# Patient Record
Sex: Female | Born: 1950 | Race: Black or African American | Hispanic: No | Smoking: Never smoker
Health system: Southern US, Community
[De-identification: ages and names within clinical notes are randomized; demographics above are authoritative.]

## PROBLEM LIST (undated history)

## (undated) DIAGNOSIS — N289 Disorder of kidney and ureter, unspecified: Secondary | ICD-10-CM

## (undated) DIAGNOSIS — N200 Calculus of kidney: Secondary | ICD-10-CM

## (undated) DIAGNOSIS — I340 Nonrheumatic mitral (valve) insufficiency: Secondary | ICD-10-CM

## (undated) DIAGNOSIS — M545 Low back pain, unspecified: Secondary | ICD-10-CM

## (undated) DIAGNOSIS — G629 Polyneuropathy, unspecified: Secondary | ICD-10-CM

## (undated) DIAGNOSIS — I428 Other cardiomyopathies: Secondary | ICD-10-CM

## (undated) DIAGNOSIS — I509 Heart failure, unspecified: Secondary | ICD-10-CM

## (undated) DIAGNOSIS — G4700 Insomnia, unspecified: Secondary | ICD-10-CM

## (undated) DIAGNOSIS — I10 Essential (primary) hypertension: Secondary | ICD-10-CM

## (undated) DIAGNOSIS — I5040 Unspecified combined systolic (congestive) and diastolic (congestive) heart failure: Secondary | ICD-10-CM

## (undated) DIAGNOSIS — I495 Sick sinus syndrome: Secondary | ICD-10-CM

## (undated) DIAGNOSIS — E0590 Thyrotoxicosis, unspecified without thyrotoxic crisis or storm: Secondary | ICD-10-CM

## (undated) DIAGNOSIS — E669 Obesity, unspecified: Secondary | ICD-10-CM

## (undated) DIAGNOSIS — E039 Hypothyroidism, unspecified: Secondary | ICD-10-CM

## (undated) DIAGNOSIS — F339 Major depressive disorder, recurrent, unspecified: Secondary | ICD-10-CM

## (undated) HISTORY — DX: Hypothyroidism, unspecified: E03.9

## (undated) HISTORY — DX: Thyrotoxicosis, unspecified without thyrotoxic crisis or storm: E05.90

## (undated) HISTORY — DX: Low back pain: M54.5

## (undated) HISTORY — DX: Essential (primary) hypertension: I10

## (undated) HISTORY — DX: Calculus of kidney: N20.0

## (undated) HISTORY — DX: Disorder of kidney and ureter, unspecified: N28.9

## (undated) HISTORY — DX: Nonrheumatic mitral (valve) insufficiency: I34.0

## (undated) HISTORY — DX: Insomnia, unspecified: G47.00

## (undated) HISTORY — DX: Polyneuropathy, unspecified: G62.9

## (undated) HISTORY — DX: Sick sinus syndrome: I49.5

## (undated) HISTORY — DX: Obesity, unspecified: E66.9

## (undated) HISTORY — PX: ELBOW SURGERY: SHX618

## (undated) HISTORY — DX: Major depressive disorder, recurrent, unspecified: F33.9

## (undated) HISTORY — DX: Unspecified combined systolic (congestive) and diastolic (congestive) heart failure: I50.40

## (undated) HISTORY — DX: Low back pain, unspecified: M54.50

## (undated) HISTORY — DX: Other cardiomyopathies: I42.8

---

## 1980-04-27 HISTORY — PX: CHOLECYSTECTOMY: SHX55

## 1997-12-27 ENCOUNTER — Ambulatory Visit (HOSPITAL_COMMUNITY): Admission: RE | Admit: 1997-12-27 | Discharge: 1997-12-27 | Payer: Self-pay | Admitting: *Deleted

## 1998-05-31 ENCOUNTER — Ambulatory Visit (HOSPITAL_COMMUNITY): Admission: RE | Admit: 1998-05-31 | Discharge: 1998-05-31 | Payer: Self-pay | Admitting: *Deleted

## 1998-05-31 ENCOUNTER — Encounter: Payer: Self-pay | Admitting: *Deleted

## 1998-06-12 ENCOUNTER — Inpatient Hospital Stay (HOSPITAL_COMMUNITY): Admission: RE | Admit: 1998-06-12 | Discharge: 1998-06-12 | Payer: Self-pay | Admitting: *Deleted

## 1999-06-25 ENCOUNTER — Encounter: Payer: Self-pay | Admitting: *Deleted

## 1999-06-25 ENCOUNTER — Ambulatory Visit (HOSPITAL_COMMUNITY): Admission: RE | Admit: 1999-06-25 | Discharge: 1999-06-25 | Payer: Self-pay | Admitting: *Deleted

## 1999-06-25 ENCOUNTER — Inpatient Hospital Stay (HOSPITAL_COMMUNITY): Admission: EM | Admit: 1999-06-25 | Discharge: 1999-07-04 | Payer: Self-pay | Admitting: Emergency Medicine

## 1999-06-26 ENCOUNTER — Encounter: Payer: Self-pay | Admitting: *Deleted

## 1999-06-27 ENCOUNTER — Encounter: Payer: Self-pay | Admitting: *Deleted

## 1999-07-01 ENCOUNTER — Encounter: Payer: Self-pay | Admitting: *Deleted

## 1999-07-02 ENCOUNTER — Encounter: Payer: Self-pay | Admitting: Endocrinology

## 1999-08-15 ENCOUNTER — Emergency Department (HOSPITAL_COMMUNITY): Admission: EM | Admit: 1999-08-15 | Discharge: 1999-08-15 | Payer: Self-pay | Admitting: Emergency Medicine

## 1999-08-15 ENCOUNTER — Encounter: Payer: Self-pay | Admitting: Emergency Medicine

## 1999-10-27 ENCOUNTER — Ambulatory Visit (HOSPITAL_COMMUNITY): Admission: RE | Admit: 1999-10-27 | Discharge: 1999-10-27 | Payer: Self-pay | Admitting: *Deleted

## 1999-10-28 ENCOUNTER — Encounter: Payer: Self-pay | Admitting: *Deleted

## 2000-05-20 ENCOUNTER — Ambulatory Visit (HOSPITAL_COMMUNITY): Admission: RE | Admit: 2000-05-20 | Discharge: 2000-05-20 | Payer: Self-pay | Admitting: *Deleted

## 2000-05-20 ENCOUNTER — Encounter: Payer: Self-pay | Admitting: *Deleted

## 2000-08-02 ENCOUNTER — Ambulatory Visit (HOSPITAL_COMMUNITY): Admission: RE | Admit: 2000-08-02 | Discharge: 2000-08-02 | Payer: Self-pay | Admitting: *Deleted

## 2000-08-02 ENCOUNTER — Encounter: Payer: Self-pay | Admitting: *Deleted

## 2000-08-03 ENCOUNTER — Inpatient Hospital Stay (HOSPITAL_COMMUNITY): Admission: EM | Admit: 2000-08-03 | Discharge: 2000-08-06 | Payer: Self-pay | Admitting: Emergency Medicine

## 2000-08-05 ENCOUNTER — Encounter: Payer: Self-pay | Admitting: Interventional Cardiology

## 2000-09-28 ENCOUNTER — Encounter: Payer: Self-pay | Admitting: *Deleted

## 2000-09-28 ENCOUNTER — Ambulatory Visit (HOSPITAL_COMMUNITY): Admission: RE | Admit: 2000-09-28 | Discharge: 2000-09-28 | Payer: Self-pay | Admitting: *Deleted

## 2000-10-08 ENCOUNTER — Encounter: Payer: Self-pay | Admitting: Interventional Cardiology

## 2000-10-08 ENCOUNTER — Inpatient Hospital Stay (HOSPITAL_COMMUNITY): Admission: EM | Admit: 2000-10-08 | Discharge: 2000-10-12 | Payer: Self-pay | Admitting: Emergency Medicine

## 2002-08-15 ENCOUNTER — Encounter: Payer: Self-pay | Admitting: Emergency Medicine

## 2002-08-15 ENCOUNTER — Inpatient Hospital Stay (HOSPITAL_COMMUNITY): Admission: EM | Admit: 2002-08-15 | Discharge: 2002-08-19 | Payer: Self-pay | Admitting: Emergency Medicine

## 2002-08-16 ENCOUNTER — Encounter: Payer: Self-pay | Admitting: Interventional Cardiology

## 2002-09-04 ENCOUNTER — Encounter: Payer: Self-pay | Admitting: *Deleted

## 2002-09-04 ENCOUNTER — Ambulatory Visit (HOSPITAL_COMMUNITY): Admission: RE | Admit: 2002-09-04 | Discharge: 2002-09-04 | Payer: Self-pay | Admitting: *Deleted

## 2003-05-18 ENCOUNTER — Emergency Department (HOSPITAL_COMMUNITY): Admission: EM | Admit: 2003-05-18 | Discharge: 2003-05-18 | Payer: Self-pay | Admitting: Emergency Medicine

## 2003-05-30 ENCOUNTER — Encounter: Admission: RE | Admit: 2003-05-30 | Discharge: 2003-05-30 | Payer: Self-pay | Admitting: *Deleted

## 2004-04-24 ENCOUNTER — Other Ambulatory Visit: Admission: RE | Admit: 2004-04-24 | Discharge: 2004-04-24 | Payer: Self-pay | Admitting: Internal Medicine

## 2004-05-07 ENCOUNTER — Encounter: Admission: RE | Admit: 2004-05-07 | Discharge: 2004-05-07 | Payer: Self-pay | Admitting: Internal Medicine

## 2005-04-09 ENCOUNTER — Emergency Department (HOSPITAL_COMMUNITY): Admission: EM | Admit: 2005-04-09 | Discharge: 2005-04-09 | Payer: Self-pay | Admitting: Emergency Medicine

## 2006-04-10 ENCOUNTER — Emergency Department (HOSPITAL_COMMUNITY): Admission: EM | Admit: 2006-04-10 | Discharge: 2006-04-10 | Payer: Self-pay | Admitting: Emergency Medicine

## 2006-07-06 ENCOUNTER — Other Ambulatory Visit: Admission: RE | Admit: 2006-07-06 | Discharge: 2006-07-06 | Payer: Self-pay | Admitting: Internal Medicine

## 2006-07-07 ENCOUNTER — Encounter: Admission: RE | Admit: 2006-07-07 | Discharge: 2006-07-07 | Payer: Self-pay | Admitting: Internal Medicine

## 2007-07-07 ENCOUNTER — Other Ambulatory Visit: Admission: RE | Admit: 2007-07-07 | Discharge: 2007-07-07 | Payer: Self-pay | Admitting: Internal Medicine

## 2008-12-19 ENCOUNTER — Encounter: Admission: RE | Admit: 2008-12-19 | Discharge: 2008-12-19 | Payer: Self-pay | Admitting: Internal Medicine

## 2010-02-24 ENCOUNTER — Other Ambulatory Visit: Admission: RE | Admit: 2010-02-24 | Discharge: 2010-02-24 | Payer: Self-pay | Admitting: Internal Medicine

## 2010-03-27 ENCOUNTER — Ambulatory Visit (HOSPITAL_COMMUNITY): Admission: RE | Admit: 2010-03-27 | Discharge: 2010-03-27 | Payer: Self-pay | Admitting: Gastroenterology

## 2011-09-26 DIAGNOSIS — N289 Disorder of kidney and ureter, unspecified: Secondary | ICD-10-CM

## 2011-09-26 HISTORY — DX: Disorder of kidney and ureter, unspecified: N28.9

## 2011-10-27 ENCOUNTER — Other Ambulatory Visit: Payer: Self-pay | Admitting: Geriatric Medicine

## 2011-10-27 ENCOUNTER — Ambulatory Visit
Admission: RE | Admit: 2011-10-27 | Discharge: 2011-10-27 | Disposition: A | Discharge status: Home / Self Care | Payer: PRIVATE HEALTH INSURANCE | Source: Ambulatory Visit | Attending: Geriatric Medicine | Admitting: Geriatric Medicine

## 2011-10-27 ENCOUNTER — Other Ambulatory Visit: Payer: Self-pay

## 2011-10-27 DIAGNOSIS — R109 Unspecified abdominal pain: Secondary | ICD-10-CM

## 2011-10-27 MED ORDER — IOHEXOL 300 MG/ML  SOLN
100.0000 mL | Freq: Once | INTRAMUSCULAR | Status: AC | PRN
  Administered 2011-10-27: 100 mL via INTRAVENOUS

## 2011-10-28 ENCOUNTER — Other Ambulatory Visit: Payer: Self-pay

## 2011-10-31 ENCOUNTER — Emergency Department (INDEPENDENT_AMBULATORY_CARE_PROVIDER_SITE_OTHER): Payer: PRIVATE HEALTH INSURANCE

## 2011-10-31 ENCOUNTER — Emergency Department (INDEPENDENT_AMBULATORY_CARE_PROVIDER_SITE_OTHER)
Admission: EM | Admit: 2011-10-31 | Discharge: 2011-10-31 | Disposition: A | Discharge status: Home Health | Payer: PRIVATE HEALTH INSURANCE | Source: Home / Self Care | Attending: Family Medicine | Admitting: Family Medicine

## 2011-10-31 ENCOUNTER — Encounter (HOSPITAL_COMMUNITY): Payer: Self-pay

## 2011-10-31 DIAGNOSIS — S63502A Unspecified sprain of left wrist, initial encounter: Secondary | ICD-10-CM

## 2011-10-31 DIAGNOSIS — S63509A Unspecified sprain of unspecified wrist, initial encounter: Secondary | ICD-10-CM

## 2011-10-31 HISTORY — DX: Heart failure, unspecified: I50.9

## 2011-10-31 MED ORDER — HYDROCODONE-ACETAMINOPHEN 5-325 MG PO TABS: 1.0000 | ORAL_TABLET | Freq: Four times a day (QID) | ORAL | Status: AC | PRN

## 2011-10-31 NOTE — ED Notes (Signed)
Pt fell yesterday and attempted to catch herself with lt arm, now having lt wrist pain.

## 2011-10-31 NOTE — ED Provider Notes (Signed)
History     CSN: [id number]  Arrival date & time 10/31/11  1204   First MD Initiated Contact with Patient 10/31/11 1217      Chief Complaint  Patient presents with  . Wrist Pain    (Consider location/radiation/quality/duration/timing/severity/associated sxs/prior treatment) Patient is a 61 y.o. female presenting with wrist pain. The history is provided by the patient.  Wrist Pain This is a new problem. The current episode started yesterday (fell yest onto left hand , c/o wrist pain, no other injury.). The problem has been gradually worsening.    Past Medical History  Diagnosis Date  . Hypertension   . CHF (congestive heart failure)     Past Surgical History  Procedure Date  . Cholecystectomy     History reviewed. No pertinent family history.  History  Substance Use Topics  . Smoking status: Never Smoker   . Smokeless tobacco: Not on file  . Alcohol Use: No    OB History    Grav Para Term Preterm Abortions TAB SAB Ect Mult Living                  Review of Systems  Constitutional: Negative.   Musculoskeletal: Positive for joint swelling. Negative for back pain and gait problem.  Skin: Negative for wound.  Neurological: Negative.     Allergies  Review of patient's allergies indicates no known allergies.  Home Medications   Current Outpatient Rx  Name Route Sig Dispense Refill  . ASPIRIN 81 MG PO TABS Oral Take 81 mg by mouth daily.    Marland Kitchen CARVEDILOL 25 MG PO TABS Oral Take 25 mg by mouth 2 (two) times daily with a meal.    . ESZOPICLONE 1 MG PO TABS Oral Take 1 mg by mouth at bedtime. Take immediately before bedtime    . FUROSEMIDE 40 MG PO TABS Oral Take 40 mg by mouth 2 (two) times daily.    Marland Kitchen RAMIPRIL 10 MG PO TABS Oral Take 10 mg by mouth 2 (two) times daily.    . SERTRALINE HCL 100 MG PO TABS Oral Take 100 mg by mouth daily.    Marland Kitchen HYDROCODONE-ACETAMINOPHEN 5-325 MG PO TABS Oral Take 1 tablet by mouth every 6 (six) hours as needed for pain. 12 tablet 0      BP 145/83  Pulse 52  Temp 98.4 F (36.9 C) (Oral)  Resp 16  SpO2 96%  Physical Exam  Nursing note and vitals reviewed. Constitutional: She is oriented to person, place, and time. She appears well-developed and well-nourished.  HENT:  Head: Normocephalic and atraumatic.  Neck: Normal range of motion. Neck supple.  Musculoskeletal: She exhibits tenderness.       Left wrist: She exhibits decreased range of motion, tenderness and bony tenderness. She exhibits no effusion and no deformity.       Arms: Neurological: She is alert and oriented to person, place, and time.  Skin: Skin is warm and dry.    ED Course  Procedures (including critical care time)  Labs Reviewed - No data to display Dg Wrist Complete Left  10/31/2011  *RADIOLOGY REPORT*  Clinical Data: Post fall, now with ulnar-sided wrist pain  LEFT WRIST - COMPLETE 3+ VIEW  Comparison: None.  Findings: No fracture or dislocation. No definite displacement of the pronator quadratus fat pad.  Degenerative changes of the STT joints of the base of the thumb. No evidence of chondrocalcinosis. Regional soft tissues are normal.  IMPRESSION: No fracture.  If the patient  has pain referable to the anatomic snuff box, splinting and a follow-up radiograph in 10 to 14 days is recommended to evaluate for occult scaphoid fracture.  Original Report Authenticated By: Waynard Reeds, M.D.     1. Sprain of left wrist       MDM  X-rays reviewed and report per radiologist.         Linna Hoff, MD 10/31/11 1340

## 2012-03-14 ENCOUNTER — Other Ambulatory Visit: Payer: Self-pay | Admitting: Internal Medicine

## 2012-03-16 ENCOUNTER — Other Ambulatory Visit: Payer: Self-pay | Admitting: Internal Medicine

## 2012-03-16 DIAGNOSIS — Z1231 Encounter for screening mammogram for malignant neoplasm of breast: Secondary | ICD-10-CM

## 2012-03-17 ENCOUNTER — Other Ambulatory Visit (HOSPITAL_COMMUNITY)
Admission: RE | Admit: 2012-03-17 | Discharge: 2012-03-17 | Disposition: A | Discharge status: Home / Self Care | Payer: PRIVATE HEALTH INSURANCE | Source: Ambulatory Visit | Attending: Internal Medicine | Admitting: Internal Medicine

## 2012-03-17 ENCOUNTER — Other Ambulatory Visit: Payer: Self-pay | Admitting: Internal Medicine

## 2012-03-17 DIAGNOSIS — Z124 Encounter for screening for malignant neoplasm of cervix: Secondary | ICD-10-CM | POA: Insufficient documentation

## 2012-03-17 DIAGNOSIS — Z1151 Encounter for screening for human papillomavirus (HPV): Secondary | ICD-10-CM | POA: Insufficient documentation

## 2012-04-26 ENCOUNTER — Ambulatory Visit: Payer: PRIVATE HEALTH INSURANCE

## 2013-03-06 ENCOUNTER — Encounter: Payer: Self-pay | Admitting: Interventional Cardiology

## 2013-05-22 ENCOUNTER — Ambulatory Visit: Payer: PRIVATE HEALTH INSURANCE | Admitting: Interventional Cardiology

## 2013-05-31 ENCOUNTER — Other Ambulatory Visit: Payer: Self-pay | Admitting: Interventional Cardiology

## 2013-06-08 ENCOUNTER — Ambulatory Visit (INDEPENDENT_AMBULATORY_CARE_PROVIDER_SITE_OTHER): Payer: PRIVATE HEALTH INSURANCE | Admitting: Interventional Cardiology

## 2013-06-08 ENCOUNTER — Encounter: Payer: Self-pay | Admitting: Interventional Cardiology

## 2013-06-08 VITALS — BP 138/76 | HR 63 | Ht 64.0 in | Wt 173.0 lb

## 2013-06-08 DIAGNOSIS — I5042 Chronic combined systolic (congestive) and diastolic (congestive) heart failure: Secondary | ICD-10-CM

## 2013-06-08 DIAGNOSIS — I428 Other cardiomyopathies: Secondary | ICD-10-CM

## 2013-06-08 DIAGNOSIS — E0590 Thyrotoxicosis, unspecified without thyrotoxic crisis or storm: Secondary | ICD-10-CM | POA: Insufficient documentation

## 2013-06-08 DIAGNOSIS — I10 Essential (primary) hypertension: Secondary | ICD-10-CM

## 2013-06-08 DIAGNOSIS — I509 Heart failure, unspecified: Secondary | ICD-10-CM | POA: Insufficient documentation

## 2013-06-08 DIAGNOSIS — E079 Disorder of thyroid, unspecified: Secondary | ICD-10-CM

## 2013-06-08 MED ORDER — CARVEDILOL 25 MG PO TABS: 25.0000 mg | ORAL_TABLET | Freq: Two times a day (BID) | ORAL | Status: DC

## 2013-06-08 MED ORDER — FUROSEMIDE 40 MG PO TABS: ORAL_TABLET | ORAL | Status: DC

## 2013-06-08 MED ORDER — RAMIPRIL 5 MG PO CAPS: ORAL_CAPSULE | ORAL | Status: DC

## 2013-06-08 NOTE — Patient Instructions (Signed)
Your physician wants you to follow-up in: 1 year with Dr. Smith You will receive a reminder letter in the mail two months in advance. If you don't receive a letter, please call our office to schedule the follow-up appointment.  Your physician recommends that you continue on your current medications as directed. Please refer to the Current Medication list given to you today.  

## 2013-06-08 NOTE — Progress Notes (Signed)
Patient ID: Katie Lee, female   DOB: 04/08/1951, 63 y.o.   MRN: [id number] Past Medical History  Hypertension   Non ischemic cardiomyopathy LVEF 20% ,2004. LVEF 40% , 2008. EF 45% echo 2012--Cardiology Dr Katrinka Blazing   Depression.   h/o hyperthyroid per patient tx with PTU, no I 131   Insomnia   09/2011 cystic structure upper pole right kidney question chronic scarring CT 09/2011   renal calculus 9 x 6 x 9 mm CT 09/2011      [address] [place], [place]  [address] Phone: [phone] Fax:  [phone]  Date:  06/08/2013   ID:  Katie Lee, DOB [birth year], MRN [id number]  PCP:  Katie Apo, MD   ASSESSMENT:  1. 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 Currently stable 2. Hypertension, controlled 3. Intermittent numbness left fifth finger  PLAN:  1. I encouraged weight loss via decreased caloric intake and aerobic exercise 2. Clinical followup in one year   SUBJECTIVE: Katie Lee is a 63 y.o. female who is doing well. She denies orthopnea, PND, and syncope. No medication side effects. No edema. Overall doing well.   Wt Readings from Last 3 Encounters:  06/08/13 173 lb (78.472 kg)     Past Medical History  Diagnosis Date  . CHF (congestive heart failure)   . Obesity, unspecified   . Essential hypertension, benign     Poor control related to weight gain  . 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 Unspecified hypothyroidism   . Peripheral neuropathy   . Sinoatrial node dysfunction     Sinus bradycardia, asymptomatic  . Low back pain   . Major depressive disorder, recurrent episode, unspecified   . Combined congestive systolic and diastolic heart failure     Asymptomatic  . Non-ischemic cardiomyopathy     EF 45% by ECHO 2012  . Insomnia   . Kidney problem 09/2011    Cystic structure upper  pole right kidney question scarring CT 09/2011  . Renal calculus     9 x 6 x 9 mm by CT 09/2011    Current Outpatient Prescriptions  Medication Sig Dispense Refill  . aspirin 81 MG tablet Take 81 mg by mouth daily.      . carvedilol (COREG) 25 MG tablet Take 25 mg by mouth 2 (two) times daily with a meal.      . Eszopiclone (ESZOPICLONE) 3 MG TABS Take 3 mg by mouth at bedtime. Take immediately before bedtime      . furosemide (LASIX) 40 MG tablet TAKE 1 TABLET BY MOUTH TWICE DAILY  60 tablet  2  . ramipril (ALTACE) 5 MG capsule TAKE 1 CAPSULE BY MOUTH TWICE DAILY  60 capsule  2  . sertraline (ZOLOFT) 100 MG tablet Take 100 mg by mouth daily.       No current facility-administered medications for this visit.    Allergies:   No Known Allergies  Social History:  The patient  reports that she has never smoked. She does not have any smokeless tobacco history on file. She reports that she does not drink alcohol or use illicit drugs.   ROS:  Please see the history of present illness.   No medication side effects   All other systems reviewed and negative.   OBJECTIVE: VS:  BP 138/76  Pulse 63  Ht  5\' 4"  (1.626 m)  Wt 173 lb (78.472 kg)  BMI 29.68 kg/m2 Well nourished, well developed, in no acute distress, moderate obesity HEENT: normal Neck: JVD flat. Carotid bruit absent  Cardiac:  normal S1, S2; RRR; no murmur. An S4 gallop is audible. Lungs:  clear to auscultation bilaterally, no wheezing, rhonchi or rales Abd: soft, nontender, no hepatomegaly Ext: Edema absent. Pulses 2+ and symmetric Skin: warm and dry Neuro:  CNs 2-12 intact, no focal abnormalities noted  EKG:  Normal sinus rhythm with left atrial abnormality and nonspecific T-wave abnormality. Otherwise normal. No areas from prior tracing 2012 to       Signed, Darci NeedleHenry W. B. Saide Lanuza III, MD 06/08/2013 2:37 PM

## 2013-12-12 ENCOUNTER — Other Ambulatory Visit: Payer: Self-pay | Admitting: Interventional Cardiology

## 2014-04-19 ENCOUNTER — Other Ambulatory Visit: Payer: Self-pay | Admitting: Interventional Cardiology

## 2014-05-17 ENCOUNTER — Other Ambulatory Visit: Payer: Self-pay | Admitting: Interventional Cardiology

## 2014-06-18 ENCOUNTER — Other Ambulatory Visit: Payer: Self-pay | Admitting: Interventional Cardiology

## 2014-06-19 ENCOUNTER — Other Ambulatory Visit: Payer: Self-pay | Admitting: Interventional Cardiology

## 2014-06-29 ENCOUNTER — Telehealth: Payer: Self-pay | Admitting: Physician Assistant

## 2014-06-29 ENCOUNTER — Other Ambulatory Visit: Payer: Self-pay | Admitting: Physician Assistant

## 2014-06-29 MED ORDER — ZOLPIDEM TARTRATE 5 MG PO TABS
5.0000 mg | ORAL_TABLET | Freq: Every evening | ORAL | Status: DC | PRN
Start: 2014-06-29 — End: 2023-07-15

## 2014-06-29 MED ORDER — ZOLPIDEM TARTRATE 5 MG PO TABS: 5.0000 mg | ORAL_TABLET | Freq: Every evening | ORAL | Status: DC | PRN

## 2014-06-29 NOTE — Telephone Encounter (Signed)
Attempted to contact patient a second time, got voice mail instead.  Ramond Dial PA Pager: [phone]

## 2014-06-29 NOTE — Telephone Encounter (Signed)
She called back to cardiology service. Patient states she run of zolpidem and unable to sleep. I have asked her to call her PCP on Monday to obtain Rx, I will only give her 7 tablet for now. I have also advised her to discuss with her PCP if she need more.   Ramond Dial PA Pager: [phone]

## 2014-07-25 ENCOUNTER — Other Ambulatory Visit: Payer: Self-pay | Admitting: Interventional Cardiology

## 2014-09-26 ENCOUNTER — Other Ambulatory Visit: Payer: Self-pay | Admitting: Interventional Cardiology

## 2014-10-05 ENCOUNTER — Encounter: Payer: Self-pay | Admitting: Interventional Cardiology

## 2014-10-05 ENCOUNTER — Ambulatory Visit (INDEPENDENT_AMBULATORY_CARE_PROVIDER_SITE_OTHER): Payer: Medicare Other | Admitting: Interventional Cardiology

## 2014-10-05 VITALS — BP 114/68 | HR 70 | Ht 64.0 in | Wt 180.2 lb

## 2014-10-05 DIAGNOSIS — I429 Cardiomyopathy, unspecified: Secondary | ICD-10-CM | POA: Diagnosis not present

## 2014-10-05 DIAGNOSIS — I10 Essential (primary) hypertension: Secondary | ICD-10-CM

## 2014-10-05 DIAGNOSIS — I428 Other cardiomyopathies: Secondary | ICD-10-CM

## 2014-10-05 DIAGNOSIS — E0590 Thyrotoxicosis, unspecified without thyrotoxic crisis or storm: Secondary | ICD-10-CM | POA: Diagnosis not present

## 2014-10-05 NOTE — Progress Notes (Signed)
Cardiology Office Note   Date:  10/05/2014   ID:  JRUE YAMBAO, DOB [birth year], MRN [id number]  PCP:  [name], MD  Cardiologist:  Lesleigh Noe, MD   Chief Complaint  Patient presents with  . Congestive Heart Failure      History of Present Illness: Katie Lee is a 64 y.o. female who presents for nonischemic cardiomyopathy, hypertension, and hyperlipidemia. She has not been physically active recently. She is not exercising. Has gained weight. She denies chest pain. There is no orthopnea PND. No peripheral edema.    Past Medical History  Diagnosis Date  . CHF (congestive heart failure)   . Obesity, unspecified   . Essential hypertension, benign     Poor control related to weight gain  . 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 Unspecified hypothyroidism   . Peripheral neuropathy   . Sinoatrial node dysfunction     Sinus bradycardia, asymptomatic  . Low back pain   . Major depressive disorder, recurrent episode, unspecified   . Combined congestive systolic and diastolic heart failure     Asymptomatic  . Non-ischemic cardiomyopathy     EF 45% by ECHO 2012  . Insomnia   . Kidney problem 09/2011    Cystic structure upper pole right kidney question scarring CT 09/2011  . Renal calculus     9 x 6 x 9 mm by CT 09/2011    Past Surgical History  Procedure Laterality Date  . Cholecystectomy  1982  . Elbow surgery Right      Current Outpatient Prescriptions  Medication Sig Dispense Refill  . aspirin 81 MG tablet Take 81 mg by mouth daily.    . carvedilol (COREG) 25 MG tablet TAKE 1 TABLET BY MOUTH TWICE DAILY WITH A MEAL 60 tablet 0  . furosemide (LASIX) 40 MG tablet TAKE 1 TABLET BY MOUTH TWICE DAILY 60 tablet 11  . ramipril (ALTACE) 5 MG capsule TAKE 1 CAPSULE BY MOUTH TWICE DAILY 60 capsule 11  . zolpidem (AMBIEN) 5 MG tablet Take 1 tablet (5 mg total) by mouth at bedtime as needed  for sleep. 7 tablet 0   No current facility-administered medications for this visit.    Allergies:   Review of patient's allergies indicates no known allergies.    Social History:  The patient  reports that she has never smoked. She has never used smokeless tobacco. She reports that she does not drink alcohol or use illicit drugs.   Family History:  The patient's family history includes Alzheimer's disease in her mother; Cancer in her father; Diabetes in her brother, brother, father, and mother; Healthy in her brother, brother, and brother; Hypertension in her brother, sister, and sister; Thyroid disease in her sister.    ROS:  Please see the history of present illness.   Otherwise, review of systems are positive for none.   All other systems are reviewed and negative.    PHYSICAL EXAM: VS:  BP 114/68 mmHg  Pulse 70  Ht  (1.626 m)  Wt 81.738 kg (180 lb 3.2 oz)  BMI 30.92 kg/m2 , BMI Body mass index is 30.92 kg/(m^2). GEN: Well nourished, well developed, in no acute distress HEENT: normal Neck: no JVD, carotid bruits, or masses Cardiac: RRR; no murmurs, rubs, or gallops,no edema  Respiratory:  clear to auscultation bilaterally, normal work of breathing GI: soft, nontender, nondistended, + BS MS: no deformity or  atrophy Skin: warm and dry, no rash Neuro:  Strength and sensation are intact Psych: euthymic mood, full affect   EKG:  EKG is ordered today. The ekg ordered today demonstrates normal sinus rhythm with left atrial abnormality and nonspecific T-wave flattening   Recent Labs: No results found for requested labs within last 365 days.    Lipid Panel No results found for: CHOL, TRIG, HDL, CHOLHDL, VLDL, LDLCALC, LDLDIRECT    Wt Readings from Last 3 Encounters:  10/05/14 81.738 kg (180 lb 3.2 oz)  06/08/13 78.472 kg (173 lb)      Other studies Reviewed: Additional studies/ records that were reviewed today include: . Review of the above records demonstrates:  Last echo was 4 years ago.   ASSESSMENT AND PLAN:  Non-ischemic cardiomyopathy - last known ejection fraction 45% . Plan: EKG 12-Lead  Essential hypertension -  clinically stable   Hyperthyroidism-  followed by her primary care     Current medicines are reviewed at length with the patient today.  The patient does not have concerns regarding medicines.  The following changes have been made:  Increase physical activity. Reassess LV function prior to the next office visit in one year  Labs/ tests ordered today include: Echocardiogram in one year   Orders Placed This Encounter  Procedures  . EKG 12-Lead  . Echocardiogram     Disposition:   FU with HS in 1 year  Signed, Lesleigh Noe, MD  10/05/2014 2:14 PM    [facility] [address] [place], [place], [place]  [address] Phone: [phone]; Fax: [phone]

## 2014-10-05 NOTE — Patient Instructions (Addendum)
Medication Instructions:  Your physician recommends that you continue on your current medications as directed. Please refer to the Current Medication list given to you today.   Labwork: None   Testing/Procedures: Your physician has requested that you have an echocardiogram. Echocardiography is a painless test that uses sound waves to create images of your heart. It provides your doctor with information about the size and shape of your heart and how well your heart's chambers and valves are working. This procedure takes approximately one hour. There are no restrictions for this procedure. ( To be scheduled in May 2017)   Follow-Up: Your physician wants you to follow-up in: 1 year with Dr.Smith You will receive a reminder letter in the mail two months in advance. If you don't receive a letter, please call our office to schedule the follow-up appointment.   Any Other Special Instructions Will Be Listed Below (If Applicable). Your physician discussed the importance of regular exercise and recommended that you start or continue a regular exercise program for good health.

## 2014-10-23 ENCOUNTER — Other Ambulatory Visit: Payer: Self-pay | Admitting: Interventional Cardiology

## 2015-06-02 ENCOUNTER — Other Ambulatory Visit: Payer: Self-pay | Admitting: Interventional Cardiology

## 2015-06-04 ENCOUNTER — Other Ambulatory Visit: Payer: Self-pay | Admitting: Interventional Cardiology

## 2015-06-28 ENCOUNTER — Other Ambulatory Visit: Payer: Self-pay | Admitting: Interventional Cardiology

## 2015-07-02 ENCOUNTER — Other Ambulatory Visit: Payer: Self-pay | Admitting: Interventional Cardiology

## 2015-07-07 ENCOUNTER — Other Ambulatory Visit: Payer: Self-pay | Admitting: Interventional Cardiology

## 2015-07-08 ENCOUNTER — Other Ambulatory Visit: Payer: Self-pay | Admitting: Interventional Cardiology

## 2015-07-08 ENCOUNTER — Other Ambulatory Visit: Payer: Self-pay

## 2015-07-08 MED ORDER — FUROSEMIDE 40 MG PO TABS: 40.0000 mg | ORAL_TABLET | Freq: Two times a day (BID) | ORAL | Status: DC

## 2015-07-08 NOTE — Telephone Encounter (Signed)
[facility] on [address] sent a fax stating they did not receive eRx for Lasix sent on 06/04/15. Rx resent.

## 2015-08-11 ENCOUNTER — Other Ambulatory Visit: Payer: Self-pay | Admitting: Interventional Cardiology

## 2015-08-20 ENCOUNTER — Other Ambulatory Visit: Payer: Self-pay | Admitting: Internal Medicine

## 2015-08-20 DIAGNOSIS — Z1231 Encounter for screening mammogram for malignant neoplasm of breast: Secondary | ICD-10-CM

## 2015-09-03 ENCOUNTER — Other Ambulatory Visit (HOSPITAL_COMMUNITY): Payer: Medicaid Other

## 2015-09-09 ENCOUNTER — Ambulatory Visit
Admission: RE | Admit: 2015-09-09 | Discharge: 2015-09-09 | Disposition: A | Discharge status: Home / Self Care | Payer: Medicare Other | Source: Ambulatory Visit | Attending: Internal Medicine | Admitting: Internal Medicine

## 2015-09-09 DIAGNOSIS — Z1231 Encounter for screening mammogram for malignant neoplasm of breast: Secondary | ICD-10-CM

## 2015-09-25 ENCOUNTER — Other Ambulatory Visit: Payer: Self-pay

## 2015-09-25 ENCOUNTER — Ambulatory Visit (HOSPITAL_COMMUNITY): Payer: Medicare Other | Attending: Interventional Cardiology

## 2015-09-25 DIAGNOSIS — E785 Hyperlipidemia, unspecified: Secondary | ICD-10-CM | POA: Diagnosis not present

## 2015-09-25 DIAGNOSIS — I509 Heart failure, unspecified: Secondary | ICD-10-CM | POA: Diagnosis not present

## 2015-09-25 DIAGNOSIS — I340 Nonrheumatic mitral (valve) insufficiency: Secondary | ICD-10-CM | POA: Insufficient documentation

## 2015-09-25 DIAGNOSIS — I428 Other cardiomyopathies: Secondary | ICD-10-CM

## 2015-09-25 DIAGNOSIS — I429 Cardiomyopathy, unspecified: Secondary | ICD-10-CM

## 2015-09-25 DIAGNOSIS — I110 Hypertensive heart disease with heart failure: Secondary | ICD-10-CM | POA: Diagnosis not present

## 2015-09-25 DIAGNOSIS — Z8249 Family history of ischemic heart disease and other diseases of the circulatory system: Secondary | ICD-10-CM | POA: Diagnosis not present

## 2015-09-30 ENCOUNTER — Other Ambulatory Visit: Payer: Self-pay | Admitting: Interventional Cardiology

## 2015-10-02 ENCOUNTER — Other Ambulatory Visit: Payer: Self-pay | Admitting: Interventional Cardiology

## 2015-10-02 NOTE — Telephone Encounter (Signed)
Lyn Records, MD at 10/05/2014 1:59 PM  carvedilol (COREG) 25 MG tabletTAKE 1 TABLET BY MOUTH TWICE DAILY WITH A MEAL Current medicines are reviewed at length with the patient today. The patient does not have concerns regarding medicines.  The following changes have been made: Increase physical activity. Reassess LV function prior to the next office visit in one year  Patient Instructions     Medication Instructions:  Your physician recommends that you continue on your current medications as directed. Please refer to the Current Medication list given to you today.

## 2015-10-08 ENCOUNTER — Telehealth: Payer: Self-pay

## 2015-10-08 NOTE — Telephone Encounter (Signed)
Pt aware of echo results and Dr.Smith's recommendation. Pt sts that she is agreeable with the switch, but she will be leaving for a planned summer long trip on Fri 6/9 and will not return to [facility] until August, this is why she scheduled her appt with Dr.Smith for that time. Adv pt that I will fwd the update to Dr.Smith and call back with his recommendation. Pt verbalized understanding.

## 2015-10-08 NOTE — Telephone Encounter (Signed)
Called to give pt echo results and Dr.Smith's recommendation. lmtcb  Notes Recorded by Lyn Records, MD on 09/28/2015 at 6:39 PM Has progression of weak heart. EF down from 45% to < 35%. Needs medication adjustment. I would like to change Ramipril to Entresto. Will discuss at OV(? When is next OV)   want her to stop Ramipril for for 2 days then start Entresto 49/51 mg BID. BMET and OV in 1 week.

## 2015-10-08 NOTE — Telephone Encounter (Signed)
-----   Message from Lyn Records, MD sent at 09/28/2015  6:39 PM EDT ----- Has progression of weak heart.  EF down from 45% to < 35%. Needs medication adjustment. I would like to change Ramipril to Entresto. Will discuss at OV(? When is next OV)

## 2015-10-10 ENCOUNTER — Telehealth: Payer: Self-pay | Admitting: Interventional Cardiology

## 2015-10-10 NOTE — Telephone Encounter (Signed)
New Message  Pt called request a call back to further discuss labs

## 2015-10-10 NOTE — Telephone Encounter (Signed)
Returned pt call. There were no recent labs ordered. lmtcb if assistance is still needed

## 2015-10-11 ENCOUNTER — Telehealth: Payer: Self-pay | Admitting: Interventional Cardiology

## 2015-10-11 NOTE — Telephone Encounter (Signed)
F/u  Pt returning Rn phone call. Please call back and discuss.   

## 2015-10-11 NOTE — Telephone Encounter (Signed)
Left message for pt to call.

## 2015-10-14 NOTE — Telephone Encounter (Signed)
MESSAGE WAS  ADDRESSED PER  PT  OLD .Katie Lee

## 2015-11-04 ENCOUNTER — Other Ambulatory Visit: Payer: Self-pay | Admitting: Interventional Cardiology

## 2015-11-04 ENCOUNTER — Other Ambulatory Visit: Payer: Self-pay | Admitting: *Deleted

## 2015-11-04 MED ORDER — CARVEDILOL 25 MG PO TABS: 25.0000 mg | ORAL_TABLET | Freq: Two times a day (BID) | ORAL | Status: DC

## 2015-12-04 ENCOUNTER — Telehealth: Payer: Self-pay | Admitting: Interventional Cardiology

## 2015-12-04 NOTE — Telephone Encounter (Signed)
Returned patients call. After pt last echo in May Dr.Smith recommended changing her heart failure medication from Ramipril to Entresto 49/51mg  bid. Pt was going to be out of town for the summer and would return in August. Her appt is scheduled with Dr.Smith for 8/22, she would like to wait until she sees Dr.Smith before switching. Adv pt that would be ok she should continue taking her current medications and can discuss the change when she sees Dr.Smith. Pt is agreeable and voiced appreciation for the call back.

## 2015-12-04 NOTE — Telephone Encounter (Signed)
New message    Patient calling back to speak with nurse on medication change from Dr. Katrinka Blazing - Carvedilol to something else .

## 2015-12-17 ENCOUNTER — Encounter (INDEPENDENT_AMBULATORY_CARE_PROVIDER_SITE_OTHER): Payer: Self-pay

## 2015-12-17 ENCOUNTER — Encounter: Payer: Self-pay | Admitting: Interventional Cardiology

## 2015-12-17 ENCOUNTER — Ambulatory Visit (INDEPENDENT_AMBULATORY_CARE_PROVIDER_SITE_OTHER): Payer: Medicare Other | Admitting: Interventional Cardiology

## 2015-12-17 VITALS — BP 162/86 | HR 57 | Ht 64.0 in | Wt 179.4 lb

## 2015-12-17 DIAGNOSIS — I5022 Chronic systolic (congestive) heart failure: Secondary | ICD-10-CM | POA: Diagnosis not present

## 2015-12-17 DIAGNOSIS — E0590 Thyrotoxicosis, unspecified without thyrotoxic crisis or storm: Secondary | ICD-10-CM

## 2015-12-17 DIAGNOSIS — I10 Essential (primary) hypertension: Secondary | ICD-10-CM | POA: Diagnosis not present

## 2015-12-17 DIAGNOSIS — I429 Cardiomyopathy, unspecified: Secondary | ICD-10-CM

## 2015-12-17 DIAGNOSIS — I428 Other cardiomyopathies: Secondary | ICD-10-CM

## 2015-12-17 MED ORDER — SACUBITRIL-VALSARTAN 24-26 MG PO TABS: 1.0000 | ORAL_TABLET | Freq: Two times a day (BID) | ORAL | 0 refills | Status: DC

## 2015-12-17 NOTE — Patient Instructions (Addendum)
Medication Instructions:  Your physician has recommended you make the following change in your medication:  1.  STOP the Ramipril 2.  START the Entresto 24/26 mg ON 12/20/15 IN THE AFTERNOON   Labwork: Non e orderd  Testing/Procedures: None ordered  Follow-Up: Your physician recommends that you schedule a follow-up appointment in: 12/24/15 WITH Nada Boozer, NP ARRIVE AT  10:45   Any Other Special Instructions Will Be Listed Below (If Applicable).     If you need a refill on your cardiac medications before your next appointment, please call your pharmacy.

## 2015-12-17 NOTE — Progress Notes (Signed)
Error

## 2015-12-17 NOTE — Progress Notes (Signed)
Cardiology Office Note    Date:  12/17/2015   ID:  Yamil, Armbrister 03-12-1951, MRN [id number]  PCP:  [name], MD  Cardiologist: Lesleigh Noe, MD   Chief Complaint  Patient presents with  . Congestive Heart Failure    History of Present Illness:  Katie Lee is a 65 y.o. female follow-up of nonischemic cardiomyopathy and hypertension.  She feels well. She denies dyspnea. She is enjoying life visiting with her grandchildren in both [place] and [place]. She denies orthopnea and lower extremity swelling. No medication side effects. After her echocardiogram was known earlier this she, I wanted to switch her to [facility], however she wanted to wait until this visit to discuss. She feels she is doing well on the current medical regimen.    Past Medical History:  Diagnosis Date  . CHF (congestive heart failure) (HCC)   . Combined congestive systolic and diastolic heart failure (HCC)    Asymptomatic  . Essential hypertension, benign    Poor control related to weight gain  . Insomnia   . Kidney problem 09/2011   Cystic structure upper pole right kidney question scarring CT 09/2011  . Low back pain   . Major depressive disorder, recurrent episode, unspecified   . Non-ischemic cardiomyopathy (HCC)    EF 45% by ECHO 2012  . Obesity, unspecified   . Peripheral neuropathy (HCC)   . Renal calculus    9 x 6 x 9 mm by CT 09/2011  . Sinoatrial node dysfunction (HCC)    Sinus bradycardia, asymptomatic  . 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 Unspecified hypothyroidism     Past Surgical History:  Procedure Laterality Date  . CHOLECYSTECTOMY  1982  . ELBOW SURGERY Right     Current Medications: Outpatient Medications Prior to Visit  Medication Sig Dispense Refill  . aspirin 81 MG tablet Take 81 mg by mouth daily.    . carvedilol (COREG) 25 MG tablet Take 1 tablet (25 mg total) by mouth 2 (two)  times daily with a meal. **Keep follow up appointment to receive further refills**Thank you 60 tablet 1  . furosemide (LASIX) 40 MG tablet Take 1 tablet (40 mg total) by mouth 2 (two) times daily. 60 tablet 1  . ramipril (ALTACE) 5 MG capsule TAKE 1 CAPSULE(5 MG) BY MOUTH TWICE DAILY 180 capsule 3  . zolpidem (AMBIEN) 5 MG tablet Take 1 tablet (5 mg total) by mouth at bedtime as needed for sleep. 7 tablet 0   No facility-administered medications prior to visit.      Allergies:   Review of patient's allergies indicates no known allergies.   Social History   Social History  . Marital status: Divorced    Spouse name: N/A  . Number of children: N/A  . Years of education: N/A   Social History Main Topics  . Smoking status: Never Smoker  . Smokeless tobacco: Never Used  . Alcohol use No  . Drug use: No  . Sexual activity: Not Asked   Other Topics Concern  . None   Social History Narrative  . None     Family History:  The patient's family history includes Alzheimer's disease in her mother; Cancer in her father; Diabetes in her brother, brother, father, and mother; Healthy in her brother, brother, and brother; Hypertension in her brother, sister, and sister; Thyroid disease in her sister.   ROS:   Please  see the history of present illness.    None  All other systems reviewed and are negative.   PHYSICAL EXAM:   VS:  BP (!) 162/86   Pulse (!) 57   Ht 5\' 4"  (1.626 m)   Wt 179 lb 6.4 oz (81.4 kg)   BMI 30.79 kg/m    GEN: Well nourished, well developed, in no acute distress  HEENT: normal  Neck: no JVD, carotid bruits, or masses Cardiac: RRR; no murmurs, rubs, or gallops,no edema  Respiratory:  clear to auscultation bilaterally, normal work of breathing GI: soft, nontender, nondistended, + BS MS: no deformity or atrophy  Skin: warm and dry, no rash Neuro:  Alert and Oriented x 3, Strength and sensation are intact Psych: euthymic mood, full affect  Wt Readings from Last 3  Encounters:  12/17/15 179 lb 6.4 oz (81.4 kg)  10/05/14 180 lb 3.2 oz (81.7 kg)  06/08/13 173 lb (78.5 kg)      Studies/Labs Reviewed:   EKG:  EKG  Sinus bradycardia, and otherwise normal.  Recent Labs: No results found for requested labs within last 8760 hours.   Lipid Panel No results found for: CHOL, TRIG, HDL, CHOLHDL, VLDL, LDLCALC, LDLDIRECT  Additional studies/ records that were reviewed today include:  Echocardiogram May 2017 ------------------------------------------------------------------- Study Conclusions  - Left ventricle: The cavity size was normal. Wall thickness was   increased in a pattern of moderate LVH. Systolic function was   moderately to severely reduced. The estimated ejection fraction   was in the range of 30% to 35%. Features are consistent with a   pseudonormal left ventricular filling pattern, with concomitant   abnormal relaxation and increased filling pressure (grade 2   diastolic dysfunction). - Mitral valve: There was mild regurgitation. - Left atrium: The atrium was moderately dilated.   ASSESSMENT:    1. Chronic systolic congestive heart failure (HCC)   2. Non-ischemic cardiomyopathy (HCC)   3. Essential hypertension   4. Hyperthyroidism      PLAN:  In order of problems listed above:  1. Discontinue Ramipril for 72 hours.. Start Entresto 24/26  mg by mouth twice a day for 7-10 days and return for follow-up and up titration by APP. BMET will be done at that time.  2. No change 3. Hopefully the medication change will better control the blood pressure. She should have a clinical follow-up with me in 6-8 weeks.    Medication Adjustments/Labs and Tests Ordered: Current medicines are reviewed at length with the patient today.  Concerns regarding medicines are outlined above.  Medication changes, Labs and Tests ordered today are listed in the Patient Instructions below. There are no Patient Instructions on file for this visit.    Signed, Lesleigh NoeHenry W Smith III, MD  12/17/2015 3:20 PM    [facility] [address], [place], [place]  [address] Phone: [phone](336) [phone]; Fax: [phone]) [phone]

## 2015-12-20 NOTE — Addendum Note (Signed)
Addended by: Channing Mutters on: 12/20/2015 08:40 AM   Modules accepted: Orders

## 2015-12-24 ENCOUNTER — Encounter: Payer: Self-pay | Admitting: Cardiology

## 2015-12-24 ENCOUNTER — Encounter (INDEPENDENT_AMBULATORY_CARE_PROVIDER_SITE_OTHER): Payer: Self-pay

## 2015-12-24 ENCOUNTER — Ambulatory Visit (INDEPENDENT_AMBULATORY_CARE_PROVIDER_SITE_OTHER): Payer: Medicare Other | Admitting: Cardiology

## 2015-12-24 VITALS — BP 128/72 | HR 64 | Ht 64.0 in | Wt 180.4 lb

## 2015-12-24 DIAGNOSIS — I10 Essential (primary) hypertension: Secondary | ICD-10-CM

## 2015-12-24 DIAGNOSIS — I428 Other cardiomyopathies: Secondary | ICD-10-CM

## 2015-12-24 DIAGNOSIS — I5022 Chronic systolic (congestive) heart failure: Secondary | ICD-10-CM | POA: Diagnosis not present

## 2015-12-24 DIAGNOSIS — Z79899 Other long term (current) drug therapy: Secondary | ICD-10-CM | POA: Diagnosis not present

## 2015-12-24 DIAGNOSIS — I429 Cardiomyopathy, unspecified: Secondary | ICD-10-CM

## 2015-12-24 MED ORDER — CARVEDILOL 25 MG PO TABS: 25.0000 mg | ORAL_TABLET | Freq: Two times a day (BID) | ORAL | 3 refills | Status: DC

## 2015-12-24 MED ORDER — FUROSEMIDE 40 MG PO TABS: 40.0000 mg | ORAL_TABLET | Freq: Two times a day (BID) | ORAL | 3 refills | Status: DC

## 2015-12-24 NOTE — Patient Instructions (Signed)
Labwork  Your physician recommends that you return for lab work on Friday, 12/27/15.    Follow-Up  Your physician recommends that you schedule a follow-up appointment on 01/08/16 for a Nurse BP check.

## 2015-12-24 NOTE — Progress Notes (Signed)
Cardiology Office Note   Date:  12/25/2015   ID:  Arella, Tremble 1950-09-18, MRN [id number]  PCP:  [name], MD  Cardiologist:  Dr. Katrinka Blazing     Chief Complaint  Patient presents with  . Medication Management    followup after beginning Entresto      History of Present Illness: Katie Lee is a 65 y.o. female who presents for NICM and titration of medication.    Last saw Dr. Katrinka Blazing 12/17/15 for eval and to adjust meds.  Entresto 24/26 was started and her ramipril was stopped 72 hours prior.  Today is back for titration and BMP.      Last Echo was 09/25/15 : Study Conclusions  - Left ventricle: The cavity size was normal. Wall thickness was   increased in a pattern of moderate LVH. Systolic function was   moderately to severely reduced. The estimated ejection fraction   was in the range of 30% to 35%. Features are consistent with a   pseudonormal left ventricular filling pattern, with concomitant   abnormal relaxation and increased filling pressure (grade 2   diastolic dysfunction). - Mitral valve: There was mild regurgitation. - Left atrium: The atrium was moderately dilated.  TODAY pt notes she has been on Entresto for 4 days.  Really too early to make changes.  Her BP has significantly changed currently.  She feels well.  We had discussion that she would prefer not to increase the dose.  She had to think for awhile to decide to begin the meds.  We will have her return Friday for labs and then in a week or so for BP check.  Will discuss with Dr. Katrinka Blazing on titration.    Past Medical History:  Diagnosis Date  . CHF (congestive heart failure) (HCC)   . Combined congestive systolic and diastolic heart failure (HCC)    Asymptomatic  . Essential hypertension, benign    Poor control related to weight gain  . Insomnia   . Kidney problem 09/2011   Cystic structure upper pole right kidney question scarring CT 09/2011  . Low back pain   . Major depressive disorder,  recurrent episode, unspecified   . Non-ischemic cardiomyopathy (HCC)    EF 45% by ECHO 2012  . Obesity, unspecified   . Peripheral neuropathy (HCC)   . Renal calculus    9 x 6 x 9 mm by CT 09/2011  . Sinoatrial node dysfunction (HCC)    Sinus bradycardia, asymptomatic  . 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 Unspecified hypothyroidism     Past Surgical History:  Procedure Laterality Date  . CHOLECYSTECTOMY  1982  . ELBOW SURGERY Right      Current Outpatient Prescriptions  Medication Sig Dispense Refill  . aspirin 81 MG tablet Take 81 mg by mouth daily.    . carvedilol (COREG) 25 MG tablet Take 1 tablet (25 mg total) by mouth 2 (two) times daily with a meal. **Keep follow up appointment to receive further refills**Thank you 180 tablet 3  . furosemide (LASIX) 40 MG tablet Take 1 tablet (40 mg total) by mouth 2 (two) times daily. 180 tablet 3  . sacubitril-valsartan (ENTRESTO) 24-26 MG Take 1 tablet by mouth 2 (two) times daily. 14 tablet 0  . zolpidem (AMBIEN) 5 MG tablet Take 1 tablet (5 mg total) by mouth at bedtime as needed for sleep. 7 tablet 0   No current  facility-administered medications for this visit.     Allergies:   Review of patient's allergies indicates no known allergies.    Social History:  The patient  reports that she has never smoked. She has never used smokeless tobacco. She reports that she does not drink alcohol or use drugs.   Family History:  The patient's family history includes Alzheimer's disease in her mother; Cancer in her father; Diabetes in her brother, brother, father, and mother; Healthy in her brother, brother, and brother; Hypertension in her brother, sister, and sister; Thyroid disease in her sister.    ROS:  General:no colds or fevers, no weight changes CV:no chest pain PUL:no SOB GI:no diarrhea constipation or melena, no indigestion Neuro:no syncope, no  lightheadedness  Wt Readings from Last 3 Encounters:  12/24/15 180 lb 6.4 oz (81.8 kg)  12/17/15 179 lb 6.4 oz (81.4 kg)  10/05/14 180 lb 3.2 oz (81.7 kg)     PHYSICAL EXAM: VS:  BP 128/72   Pulse 64   Ht 5\' 4"  (1.626 m)   Wt 180 lb 6.4 oz (81.8 kg)   BMI 30.97 kg/m  , BMI Body mass index is 30.97 kg/m. General:Pleasant affect, NAD Heart:S1S2 RRR without murmur, gallup, rub or click Lungs:clear without rales, rhonchi, or wheezes Neuro:alert and oriented, MAE, follows commands, + facial symmetry    EKG:  EKG is NOT ordered today.   Recent Labs: No results found for requested labs within last 8760 hours.    Lipid Panel No results found for: CHOL, TRIG, HDL, CHOLHDL, VLDL, LDLCALC, LDLDIRECT     Other studies Reviewed: Additional studies/ records that were reviewed today include: see above and previous OV notes   ASSESSMENT AND PLAN:   1. Chronic systolic congestive heart failure (HCC)   2. Non-ischemic cardiomyopathy (HCC)   3. Essential hypertension   4. Hyperthyroidism    No change in medication today, too early.  Check BMP on Friday and BP check next week. Will check with Dr. Katrinka BlazingSmith on his desire on titration or not per pt.    Current medicines are reviewed with the patient today.  The patient Has no concerns regarding medicines.  The following changes have been made:  See above Labs/ tests ordered today include:see above  Disposition:   FU:  see above  Signed, Nada BoozerLaura Zubin Pontillo, NP  12/25/2015 10:22 AM    [facility] [address], [place], [place]  [address]/ [address] [place], [place] Phone: [phone](336) [phone]; Fax: [phone]) [phone]  [phone]

## 2015-12-25 ENCOUNTER — Encounter: Payer: Self-pay | Admitting: Cardiology

## 2015-12-25 ENCOUNTER — Ambulatory Visit: Payer: Medicare Other | Admitting: Physician Assistant

## 2015-12-25 NOTE — Progress Notes (Signed)
Stay on lower dose if similar BP, but if running > 130/90 mmHg I would titrate to the intermediate dose.

## 2015-12-27 ENCOUNTER — Encounter (INDEPENDENT_AMBULATORY_CARE_PROVIDER_SITE_OTHER): Payer: Self-pay

## 2015-12-27 ENCOUNTER — Other Ambulatory Visit: Payer: Medicare Other | Admitting: *Deleted

## 2015-12-27 DIAGNOSIS — I5020 Unspecified systolic (congestive) heart failure: Secondary | ICD-10-CM

## 2015-12-27 DIAGNOSIS — I10 Essential (primary) hypertension: Secondary | ICD-10-CM

## 2015-12-27 DIAGNOSIS — E0590 Thyrotoxicosis, unspecified without thyrotoxic crisis or storm: Secondary | ICD-10-CM

## 2015-12-27 LAB — BASIC METABOLIC PANEL
BUN: 9 mg/dL (ref 7–25)
CHLORIDE: 103 mmol/L (ref 98–110)
CO2: 28 mmol/L (ref 20–31)
Calcium: 9 mg/dL (ref 8.6–10.4)
Creat: 0.85 mg/dL (ref 0.50–0.99)
Glucose, Bld: 104 mg/dL — ABNORMAL HIGH (ref 65–99)
Potassium: 3.8 mmol/L (ref 3.5–5.3)
SODIUM: 141 mmol/L (ref 135–146)

## 2015-12-27 NOTE — Addendum Note (Signed)
Addended by: Tonita Phoenix on: 12/27/2015 09:01 AM   Modules accepted: Orders

## 2016-01-07 NOTE — Progress Notes (Signed)
Patient ID: Katie Lee                 DOB: 1950-10-27                      MRN: [id number]     HPI: Katie Lee is a 65 y.o. female patient of Dr. Katrinka Blazing referred to HTN clinic by Nada Boozer, NP. PMH is significant for NiCMP and HFrEF with LVEF 30-35% on May 2017 echo, HTN, and hypothyroidism. Pt was seen in clinic 2 weeks ago, at that time pt had been on Entresto 24-26mg  BID for 4 days and BP had improved from 162/86 to 128/72.Per Dr. Katrinka Blazing, he would like pt to stay on this dose if SBP remains 120s but titrate up if BP > 130/62mmHg at home. Patient presents today for BP check and HF medication optimization.  Pt denies dizziness, blurred vision, or headache. Has not had any falls. Did not drink any caffeine this morning. Already took her morning doses of medications. Reports adherence with BID dosing of cardiac medications.  Current HTN meds: carvedilol 25mg  BID, furosemide 40mg  BID, Entresto 24-26mg  BID BP goal: <140/75mmHg  Family History: The patient's family history includes Alzheimer's disease in her mother; Cancer in her father; Diabetes in her brother, brother, father, and mother; Hypertension in her brother, sister, and sister; Thyroid disease in her sister.   Social History: The patient  reports that she has never smoked. She has never used smokeless tobacco. She reports that she does not drink alcohol or use drugs.   Home BP readings: does not have a BP cuff to check home readings  Labs: BMET 12/31/15: K 3.8, SCr 0.85 (on Entresto for 1 week)  Wt Readings from Last 3 Encounters:  12/24/15 180 lb 6.4 oz (81.8 kg)  12/17/15 179 lb 6.4 oz (81.4 kg)  10/05/14 180 lb 3.2 oz (81.7 kg)   BP Readings from Last 3 Encounters:  12/24/15 128/72  12/17/15 (!) 162/86  10/05/14 114/68   Pulse Readings from Last 3 Encounters:  12/24/15 64  12/17/15 (!) 57  10/05/14 70    Renal function: CrCl cannot be calculated (Unknown ideal weight.).  Past Medical History:  Diagnosis Date   . CHF (congestive heart failure) (HCC)   . Combined congestive systolic and diastolic heart failure (HCC)    Asymptomatic  . Essential hypertension, benign    Poor control related to weight gain  . Insomnia   . Kidney problem 09/2011   Cystic structure upper pole right kidney question scarring CT 09/2011  . Low back pain   . Major depressive disorder, recurrent episode, unspecified   . Non-ischemic cardiomyopathy (HCC)    EF 45% by ECHO 2012  . Obesity, unspecified   . Peripheral neuropathy (HCC)   . Renal calculus    9 x 6 x 9 mm by CT 09/2011  . Sinoatrial node dysfunction (HCC)    Sinus bradycardia, asymptomatic  . 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 Unspecified hypothyroidism     Current Outpatient Prescriptions on File Prior to Visit  Medication Sig Dispense Refill  . aspirin 81 MG tablet Take 81 mg by mouth daily.    . carvedilol (COREG) 25 MG tablet Take 1 tablet (25 mg total) by mouth 2 (two) times daily with a meal. **Keep follow up appointment to receive further refills**Thank you 180 tablet 3  . furosemide (LASIX) 40  MG tablet Take 1 tablet (40 mg total) by mouth 2 (two) times daily. 180 tablet 3  . sacubitril-valsartan (ENTRESTO) 24-26 MG Take 1 tablet by mouth 2 (two) times daily. 14 tablet 0  . zolpidem (AMBIEN) 5 MG tablet Take 1 tablet (5 mg total) by mouth at bedtime as needed for sleep. 7 tablet 0   No current facility-administered medications on file prior to visit.     No Known Allergies   Assessment/Plan:  1. Hypertension with HFrEF med titration: BP 136/4074mmHg today at goal <140/1590mmHg. There is room to optimize her HF medications. Will increase Entresto to 49/51mg  BID dose and continue furosemide and max dose carvedilol. Pt provided with samples and rx sent to pt's pharmacy. She has both Medicare and Medicaid so coverage will not likely be an issue. F/u in HTN clinic in 10 days for  repeat BMET and BP check. If BP allows, pt would also benefit from addition of spironolactone in the future.   Shadoe Bethel E. Elasha Tess, PharmD, CPP [facility] [address], [place], [place] [address] Phone: [phone](336) [phone]; Fax: [phone]) [phone] 01/08/2016 9:16 AM

## 2016-01-08 ENCOUNTER — Ambulatory Visit (INDEPENDENT_AMBULATORY_CARE_PROVIDER_SITE_OTHER): Payer: Medicare Other | Admitting: Pharmacist

## 2016-01-08 VITALS — BP 136/74 | HR 72

## 2016-01-08 DIAGNOSIS — I10 Essential (primary) hypertension: Secondary | ICD-10-CM | POA: Diagnosis not present

## 2016-01-08 MED ORDER — SACUBITRIL-VALSARTAN 49-51 MG PO TABS: 1.0000 | ORAL_TABLET | Freq: Two times a day (BID) | ORAL | 3 refills | Status: DC

## 2016-01-08 NOTE — Patient Instructions (Signed)
It was nice to see you today.  INCREASE the dose of your Entresto to the 49/51mg  dose twice a day. Continue taking your other heart medications.  Follow up for lab and a blood pressure check on Monday, September 25th at 2pm.

## 2016-01-20 ENCOUNTER — Ambulatory Visit (INDEPENDENT_AMBULATORY_CARE_PROVIDER_SITE_OTHER): Payer: Medicare Other | Admitting: Pharmacist

## 2016-01-20 ENCOUNTER — Other Ambulatory Visit: Payer: Medicare Other | Admitting: *Deleted

## 2016-01-20 ENCOUNTER — Encounter (INDEPENDENT_AMBULATORY_CARE_PROVIDER_SITE_OTHER): Payer: Self-pay

## 2016-01-20 VITALS — BP 130/80 | HR 70

## 2016-01-20 DIAGNOSIS — I10 Essential (primary) hypertension: Secondary | ICD-10-CM

## 2016-01-20 LAB — BASIC METABOLIC PANEL
BUN: 10 mg/dL (ref 7–25)
CHLORIDE: 105 mmol/L (ref 98–110)
CO2: 28 mmol/L (ref 20–31)
CREATININE: 0.85 mg/dL (ref 0.50–0.99)
Calcium: 9.1 mg/dL (ref 8.6–10.4)
Glucose, Bld: 105 mg/dL — ABNORMAL HIGH (ref 65–99)
POTASSIUM: 4.8 mmol/L (ref 3.5–5.3)
Sodium: 139 mmol/L (ref 135–146)

## 2016-01-20 NOTE — Patient Instructions (Signed)
It was nice to see you today.  Continue taking your current medications.  Check your blood pressure at home and record readings.  Follow up in clinic in 4 weeks to discuss adding spironolactone.

## 2016-01-20 NOTE — Progress Notes (Signed)
Patient ID: Katie Lee                 DOB: Aug 12, 1950                      MRN: [id number]     HPI: Katie Lee is a 65 y.o. female who presents to HTN clinic for follow up. PMH is significant for NiCMP and HFrEF with LVEF 30-35% on May 2017 echo, HTN, and hypothyroidism. Pt was seen in clinic 2 weeks ago, at that time pt had been on Entresto 24-26mg  BID for 4 days and BP had improved from 162/86 to 128/72. Per Dr. Katrinka Blazing, he would like pt to stay on this dose if SBP remains 120s but titrate up if BP > 130/83mmHg at home. At last HTN visit, Entresto dose was increased to 49/51. Patient presents today for BP follow up and BMET.   Pt denies dizziness, blurred vision, or headache. Has not had any falls. Did not drink any caffeine this morning. Already took her morning doses of medications. Reports adherence with BID dosing of cardiac medications.  Has ordered a wrist BP cuff, but it has not come in yet. Has been staying more active - line dancing frequently. Eating better and exercising more. Has picked up her Entresto from the pharmacy and it is affordable.  Current HTN meds: carvedilol 25mg  BID, furosemide 40mg  BID, Entresto 49-51mg  BID BP goal: <140/69mmHg  Family History: The patient's family history includes Alzheimer's disease in her mother; Cancer in her father; Diabetes in her brother, brother, father, and mother; Hypertension in her brother, sister, and sister; Thyroid disease in her sister.  Social History: The patient reports that she has never smoked. She has never used smokeless tobacco. She reports that she does not drink alcohol or use drugs.  Home BP readings: does not have a BP cuff to check home readings  Labs: BMET 12/31/15: K 3.8, SCr 0.85 (on Entresto for 1 week)  Wt Readings from Last 3 Encounters:  12/24/15 180 lb 6.4 oz (81.8 kg)  12/17/15 179 lb 6.4 oz (81.4 kg)  10/05/14 180 lb 3.2 oz (81.7 kg)   BP Readings from Last 3 Encounters:  01/08/16 136/74    12/24/15 128/72  12/17/15 (!) 162/86   Pulse Readings from Last 3 Encounters:  01/08/16 72  12/24/15 64  12/17/15 (!) 57    Renal function: CrCl cannot be calculated (Unknown ideal weight.).  Past Medical History:  Diagnosis Date  . CHF (congestive heart failure) (HCC)   . Combined congestive systolic and diastolic heart failure (HCC)    Asymptomatic  . Essential hypertension, benign    Poor control related to weight gain  . Insomnia   . Kidney problem 09/2011   Cystic structure upper pole right kidney question scarring CT 09/2011  . Low back pain   . Major depressive disorder, recurrent episode, unspecified   . Non-ischemic cardiomyopathy (HCC)    EF 45% by ECHO 2012  . Obesity, unspecified   . Peripheral neuropathy (HCC)   . Renal calculus    9 x 6 x 9 mm by CT 09/2011  . Sinoatrial node dysfunction (HCC)    Sinus bradycardia, asymptomatic  . 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 Unspecified hypothyroidism     Current Outpatient Prescriptions on File Prior to Visit  Medication Sig Dispense Refill  . aspirin 81 MG tablet Take 81  mg by mouth daily.    . carvedilol (COREG) 25 MG tablet Take 1 tablet (25 mg total) by mouth 2 (two) times daily with a meal. **Keep follow up appointment to receive further refills**Thank you 180 tablet 3  . furosemide (LASIX) 40 MG tablet Take 1 tablet (40 mg total) by mouth 2 (two) times daily. 180 tablet 3  . sacubitril-valsartan (ENTRESTO) 49-51 MG Take 1 tablet by mouth 2 (two) times daily. 180 tablet 3  . zolpidem (AMBIEN) 5 MG tablet Take 1 tablet (5 mg total) by mouth at bedtime as needed for sleep. 7 tablet 0   No current facility-administered medications on file prior to visit.     No Known Allergies   Assessment/Plan:  1. Hypertension with HFrEF med titration: BP mmHg today at goal <140/5690mmHg. There is room to optimize her HF medications but pt is hesitant and  does not want to add new medications today. Will continue Entresto 49/51mg  BID, carvedilol 25mg  BID, and furosemide 40mg  BID for now. Would receive mortality benefit from addition of spironolactone in the future given reduced LVEF 30-35% but pt needs some time before she is agreeable to this. Will f/u in 4 weeks to reassess pt's readiness to start spironolactone.  Checking BMET today with recent dose increase of Entresto.   Megan E. Supple, PharmD, CPP [facility] [address], [place], [place] [address] Phone: [phone](336) [phone]; Fax: [phone]) [phone] 01/20/2016 2:25 PM

## 2016-03-04 ENCOUNTER — Ambulatory Visit (INDEPENDENT_AMBULATORY_CARE_PROVIDER_SITE_OTHER): Payer: Medicare Other | Admitting: Pharmacist

## 2016-03-04 VITALS — BP 128/60 | HR 57

## 2016-03-04 DIAGNOSIS — I5020 Unspecified systolic (congestive) heart failure: Secondary | ICD-10-CM

## 2016-03-04 DIAGNOSIS — I10 Essential (primary) hypertension: Secondary | ICD-10-CM

## 2016-03-04 NOTE — Patient Instructions (Addendum)
Continue taking your medications as prescribed: carvedilol (COREG) 25mg  twice daily, furosemide (LASIX) 40mg  twice daily, Entresto 49-51mg  twice daily.  Continue with diet and exercise.   Call the clinic to schedule a visit if you experience any side effects or have concerns you would like addressed.

## 2016-03-04 NOTE — Progress Notes (Signed)
Patient ID: Katie Lee                 DOB: [birth year]                      MRN: [id number]     HPI: Katie Lee is a 65 y.o. female who presents to HTN clinic for follow-up. PMH is significant for NiCMP and HFrEF with LVEF 30-35% on May 2017 echo, HTN, and hypothyroidism. BP at last clinic visit on 9/25 was 130/80 mmHg. Discussed optimizing her HF medications by adding spironolactone since she has reduced LVEF 30-35%, but pt was hesitant to add new medications. F/u visit today to assess readiness to add spironolactone.  Patient reports that she is tolerating Entresto and denies any ADRs. She denies CP, SOB, dizziness, faintness, blurred vision, and falls. Patient states that she does not want to start spironolactone. She says that she's doing well right now and would rather manage her disease state with diet and exercise. She feels like she should wait to to start if she starts feeling bad or something happens with her heart failure.  Current HTN meds: carvedilol 25mg  BID, furosemide 40mg  BID, Entresto 49-51mg  BID BP goal: <140/5090mmHg  Family History: The patient's family history includes Alzheimer's disease in her mother; Cancer in her father; Diabetes in her brother, brother, father, and mother; Hypertension in her brother, sister, and sister; Thyroid disease in her sister.  Social History: The patientreports that she has never smoked. She has never used smokeless tobacco. She reports that she does not drink alcohol or use drugs.  Diet: Juices everyday with fruits and vegetables and limits her salt intake  Exercise: She usually walks daily, but hasn't lately since [place] She participates in line-dancing once per week and is looking into starting water aerobics.   Home BP readings: started checking her BP at home. Her BP today was 116/66 (post meds) and 137/80 a few days ago. She tries to be consistent with checking.  Labs: BMET 01/20/16: K 4.8, SCr  0.85  Wt Readings from Last 3 Encounters:  12/24/15 180 lb 6.4 oz (81.8 kg)  12/17/15 179 lb 6.4 oz (81.4 kg)  10/05/14 180 lb 3.2 oz (81.7 kg)   BP Readings from Last 3 Encounters:  01/20/16 130/80  01/08/16 136/74  12/24/15 128/72   Pulse Readings from Last 3 Encounters:  01/20/16 70  01/08/16 72  12/24/15 64    Renal function: CrCl cannot be calculated (Patient's most recent lab result is older than the maximum 21 days allowed.).  Past Medical History:  Diagnosis Date  . CHF (congestive heart failure) (HCC)   . Combined congestive systolic and diastolic heart failure (HCC)    Asymptomatic  . Essential hypertension, benign    Poor control related to weight gain  . Insomnia   . Kidney problem 09/2011   Cystic structure upper pole right kidney question scarring CT 09/2011  . Low back pain   . Major depressive disorder, recurrent episode, unspecified (HCC)   . Non-ischemic cardiomyopathy (HCC)    EF 45% by ECHO 2012  . Obesity, unspecified   . Peripheral neuropathy (HCC)   . Renal calculus    9 x 6 x 9 mm by CT 09/2011  . Sinoatrial node dysfunction (HCC)    Sinus bradycardia, asymptomatic  . 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  Unspecified hypothyroidism     Current Outpatient Prescriptions on File Prior to Visit  Medication Sig Dispense Refill  . aspirin 81 MG tablet Take 81 mg by mouth daily.    . carvedilol (COREG) 25 MG tablet Take 1 tablet (25 mg total) by mouth 2 (two) times daily with a meal. **Keep follow up appointment to receive further refills**Thank you 180 tablet 3  . furosemide (LASIX) 40 MG tablet Take 1 tablet (40 mg total) by mouth 2 (two) times daily. 180 tablet 3  . sacubitril-valsartan (ENTRESTO) 49-51 MG Take 1 tablet by mouth 2 (two) times daily. 180 tablet 3  . zolpidem (AMBIEN) 5 MG tablet Take 1 tablet (5 mg total) by mouth at bedtime as needed for sleep. 7 tablet 0   No  current facility-administered medications on file prior to visit.     No Known Allergies   Assessment/Plan:  1. Hypertension with HFrEF med titration - BP stable and at goal but patient is unwilling to start spironolactone. Discussed in detail the benefit of spironolactone in improving mortality outcomes, reducing HF symptoms, and decreasing the risk of hospitalization. Explained that HF is a progressive disease and the goal is to delay disease progression prior to symptom worsening. This is the second failed attempt to add spironolactone. Will have pt continue her current meduication regimen of carvedilol 25mg  BID, furosemide, 40mg  BID, and Entresto 49-51mg  BID. No follow-up pharmacy visit currently needed since HF medications are optimized as much as pt is willing. Advised patient to call clinic with any ADRs or any concerns.    Megan E. Supple, PharmD, CPP [facility] [address], [place], [place] [address] Phone: [phone]; Fax: [phone] 03/04/2016 2:37 PM

## 2016-08-26 ENCOUNTER — Other Ambulatory Visit: Payer: Self-pay | Admitting: Internal Medicine

## 2016-08-26 DIAGNOSIS — Z1231 Encounter for screening mammogram for malignant neoplasm of breast: Secondary | ICD-10-CM

## 2016-09-11 ENCOUNTER — Ambulatory Visit: Payer: Medicare Other

## 2016-12-20 ENCOUNTER — Other Ambulatory Visit: Payer: Self-pay | Admitting: Interventional Cardiology

## 2016-12-20 ENCOUNTER — Other Ambulatory Visit: Payer: Self-pay | Admitting: Cardiology

## 2017-02-08 ENCOUNTER — Other Ambulatory Visit: Payer: Self-pay | Admitting: Interventional Cardiology

## 2017-02-08 ENCOUNTER — Other Ambulatory Visit: Payer: Self-pay | Admitting: Cardiology

## 2017-02-10 MED ORDER — CARVEDILOL 25 MG PO TABS: ORAL_TABLET | ORAL | 0 refills | Status: DC

## 2018-02-10 ENCOUNTER — Other Ambulatory Visit: Payer: Self-pay | Admitting: Internal Medicine

## 2018-02-10 DIAGNOSIS — Z1231 Encounter for screening mammogram for malignant neoplasm of breast: Secondary | ICD-10-CM

## 2018-03-17 ENCOUNTER — Ambulatory Visit: Payer: Medicare Other

## 2018-03-21 ENCOUNTER — Ambulatory Visit (INDEPENDENT_AMBULATORY_CARE_PROVIDER_SITE_OTHER): Payer: Medicare Other | Admitting: Nurse Practitioner

## 2018-03-21 ENCOUNTER — Encounter: Payer: Self-pay | Admitting: Nurse Practitioner

## 2018-03-21 VITALS — BP 140/78 | HR 64 | Ht 64.0 in | Wt 176.8 lb

## 2018-03-21 DIAGNOSIS — I10 Essential (primary) hypertension: Secondary | ICD-10-CM | POA: Diagnosis not present

## 2018-03-21 DIAGNOSIS — I428 Other cardiomyopathies: Secondary | ICD-10-CM | POA: Diagnosis not present

## 2018-03-21 MED ORDER — CARVEDILOL 25 MG PO TABS: ORAL_TABLET | ORAL | 3 refills | Status: DC

## 2018-03-21 MED ORDER — FUROSEMIDE 40 MG PO TABS: ORAL_TABLET | ORAL | 3 refills | Status: DC

## 2018-03-21 MED ORDER — SACUBITRIL-VALSARTAN 49-51 MG PO TABS: 1.0000 | ORAL_TABLET | Freq: Two times a day (BID) | ORAL | 3 refills | Status: DC

## 2018-03-21 NOTE — Patient Instructions (Addendum)
We will be checking the following labs today - NONE   Medication Instructions:    Continue with your current medicines.   I did send in your refills today    If you need a refill on your cardiac medications before your next appointment, please call your pharmacy.     Testing/Procedures To Be Arranged:  Echocardiogram  Follow-Up:   See Dr. Katrinka Blazing in about 6 months    At [facility], you and your health needs are our priority.  As part of our continuing mission to provide you with exceptional heart care, we have created designated Provider Care Teams.  These Care Teams include your primary Cardiologist (physician) and Advanced Practice Providers (APPs -  Physician Assistants and Nurse Practitioners) who all work together to provide you with the care you need, when you need it.  Special Instructions:  . None  Call the [facility] office at [phone] if you have any questions, problems or concerns.

## 2018-03-21 NOTE — Progress Notes (Signed)
CARDIOLOGY OFFICE NOTE  Date:  03/21/2018    Hall Busing Date of Birth: 24-Jan-1951 Medical Record #[id number]  PCP:  Renford Dills, MD  Cardiologist:  Katrinka Blazing    Chief Complaint  Patient presents with  . Cardiomyopathy    Follow up visit - seen for Dr. Katrinka Blazing    History of Present Illness: Katie Lee is a 67 y.o. female who presents today for a follow up visit. Seen for Dr. Katrinka Blazing  She has a history of NICM with EF of 30 to 35% in 2017, HTN and hypothyroidism.   Last seen by Dr. Katrinka Blazing in August of 2017 Sherryll Burger was started. Then referred to HTN clinic here - last seen in November of 2017. Has had notedhesitance with starting more medicines for her heart failure due to absence of symptoms.   Comes in today. Here alone. She feels like she is doing well. Cancelled her last appointments over the past 2 years due to travel. No chest pain. Breathing is good. No swelling. Her weight is stable. She says her PCP did her refills. She feels like she is doing well. No swelling. She does get some extra salt on occasion.   Past Medical History:  Diagnosis Date  . CHF (congestive heart failure) (HCC)   . Combined congestive systolic and diastolic heart failure (HCC)    Asymptomatic  . Essential hypertension, benign    Poor control related to weight gain  . Insomnia   . Kidney problem 09/2011   Cystic structure upper pole right kidney question scarring CT 09/2011  . Low back pain   . Major depressive disorder, recurrent episode, unspecified   . Non-ischemic cardiomyopathy (HCC)    EF 45% by ECHO 2012  . Obesity, unspecified   . Peripheral neuropathy   . Renal calculus    9 x 6 x 9 mm by CT 09/2011  . Sinoatrial node dysfunction (HCC)    Sinus bradycardia, asymptomatic  . 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 Unspecified hypothyroidism     Past Surgical History:  Procedure Laterality Date  .  CHOLECYSTECTOMY  1982  . ELBOW SURGERY Right      Medications: Current Meds  Medication Sig  . aspirin 81 MG tablet Take 81 mg by mouth daily.  . carvedilol (COREG) 25 MG tablet TAKE 1 TABLET BY MOUTH TWICE DAILY WITH A MEAL. Please schedule appointment  . furosemide (LASIX) 40 MG tablet TAKE 1 TABLET(40 MG) BY MOUTH TWICE DAILY  . sacubitril-valsartan (ENTRESTO) 49-51 MG Take 1 tablet by mouth 2 (two) times daily.  Marland Kitchen zolpidem (AMBIEN) 5 MG tablet Take 1 tablet (5 mg total) by mouth at bedtime as needed for sleep.  . [DISCONTINUED] carvedilol (COREG) 25 MG tablet TAKE 1 TABLET BY MOUTH TWICE DAILY WITH A MEAL. Please schedule appointment  . [DISCONTINUED] furosemide (LASIX) 40 MG tablet TAKE 1 TABLET(40 MG) BY MOUTH TWICE DAILY  . [DISCONTINUED] sacubitril-valsartan (ENTRESTO) 49-51 MG Take 1 tablet by mouth 2 (two) times daily. PLEASE SCHEDULE APPOINTMENT     Allergies: No Known Allergies  Social History: The patient  reports that she has never smoked. She has never used smokeless tobacco. She reports that she does not drink alcohol or use drugs.   Family History: The patient's family history includes Alzheimer's disease in her mother; Cancer in her father; Diabetes in her brother, brother, father, and mother; Healthy in her brother, brother,  and brother; Hypertension in her brother, sister, and sister; Thyroid disease in her sister.   Review of Systems: Please see the history of present illness.   Otherwise, the review of systems is positive for none.   All other systems are reviewed and negative.   Physical Exam: VS:  BP 140/78 (BP Location: Left Arm, Patient Position: Sitting, Cuff Size: Normal)   Pulse 64   Ht 5\' 4"  (1.626 m)   Wt 176 lb 12.8 oz (80.2 kg)   BMI 30.35 kg/m  .  BMI Body mass index is 30.35 kg/m.  Wt Readings from Last 3 Encounters:  03/21/18 176 lb 12.8 oz (80.2 kg)  12/24/15 180 lb 6.4 oz (81.8 kg)  12/17/15 179 lb 6.4 oz (81.4 kg)    General:  Pleasant. She looks younger than her stated age. Alert and in no acute distress.   HEENT: Normal but with poor dentition. Missing teeth.   Neck: Supple, no JVD, carotid bruits, or masses noted.  Cardiac: Regular rate and rhythm. No murmurs, rubs, or gallops. No edema.  Respiratory:  Lungs are clear to auscultation bilaterally with normal work of breathing.  GI: Soft and nontender.  MS: No deformity or atrophy. Gait and ROM intact.  Skin: Warm and dry. Color is normal.  Neuro:  Strength and sensation are intact and no gross focal deficits noted.  Psych: Alert, appropriate and with normal affect.   LABORATORY DATA:  EKG:  EKG is ordered today. This demonstrates NSR.  Lab Results  Component Value Date   GLUCOSE 105 (H) 01/20/2016   NA 139 01/20/2016   K 4.8 01/20/2016   CL 105 01/20/2016   CREATININE 0.85 01/20/2016   BUN 10 01/20/2016   CO2 28 01/20/2016       BNP (last 3 results) No results for input(s): BNP in the last 8760 hours.  ProBNP (last 3 results) No results for input(s): PROBNP in the last 8760 hours.   Other Studies Reviewed Today:  Echocardiogram May 2017 ------------------------------------------------------------------- Study Conclusions  - Left ventricle: The cavity size was normal. Wall thickness was increased in a pattern of moderate LVH. Systolic function was moderately to severely reduced. The estimated ejection fraction was in the range of 30% to 35%. Features are consistent with a pseudonormal left ventricular filling pattern, with concomitant abnormal relaxation and increased filling pressure (grade 2 diastolic dysfunction). - Mitral valve: There was mild regurgitation. - Left atrium: The atrium was moderately dilated.   Assessment/Plan:  1. Chronic systolic heart failure - EF of 30 to 35 % - she remains pretty hesitant to add additional guideline therapy. She is doing quite well clinically.  Labs from last month noted. She is  agreeable to getting her echo updated - ? If candidate for ICD. She says she would consider but for now, does not wish to change her medical regimen. Meds refilled today. No active symptoms at this time. Salt restriction advised.   2. NICM - see #1.   3. HTN - recheck by me is 136/80. She does not wish to add additional medicine at this time. She does admit she could do a better job with monitoring her BP at home and will try to monitor. Salt restriction encouraged.   Current medicines are reviewed with the patient today.  The patient does not have concerns regarding medicines other than what has been noted above.  The following changes have been made:  See above.  Labs/ tests ordered today include:    Orders  Placed This Encounter  Procedures  . EKG 12-Lead  . ECHOCARDIOGRAM COMPLETE     Disposition:   FU with Dr. Katrinka Blazing in 6 months.    Patient is agreeable to this plan and will call if any problems develop in the interim.   SignedNorma Fredrickson, NP  03/21/2018 11:19 AM  [facility] [address] [place], [place]  [address] Phone: [phone] Fax: [phone]

## 2018-03-23 ENCOUNTER — Ambulatory Visit
Admission: RE | Admit: 2018-03-23 | Discharge: 2018-03-23 | Disposition: A | Discharge status: Home / Self Care | Payer: Medicare Other | Source: Ambulatory Visit | Attending: Internal Medicine | Admitting: Internal Medicine

## 2018-03-23 DIAGNOSIS — Z1231 Encounter for screening mammogram for malignant neoplasm of breast: Secondary | ICD-10-CM

## 2018-03-28 ENCOUNTER — Other Ambulatory Visit: Payer: Self-pay

## 2018-03-28 ENCOUNTER — Ambulatory Visit (HOSPITAL_COMMUNITY): Payer: Medicare Other | Attending: Cardiovascular Disease

## 2018-03-28 DIAGNOSIS — I428 Other cardiomyopathies: Secondary | ICD-10-CM

## 2018-04-07 NOTE — Progress Notes (Signed)
Noted  

## 2018-09-13 ENCOUNTER — Telehealth: Payer: Self-pay

## 2018-09-13 NOTE — Telephone Encounter (Signed)
YOUR CARDIOLOGY TEAM HAS ARRANGED FOR AN E-VISIT FOR YOUR APPOINTMENT - PLEASE REVIEW IMPORTANT INFORMATION BELOW SEVERAL DAYS PRIOR TO YOUR APPOINTMENT  Due to the recent COVID-19 pandemic, we are transitioning in-person office visits to tele-medicine visits in an effort to decrease unnecessary exposure to our patients, their families, and staff. These visits are billed to your insurance just like a normal visit is. We also encourage you to sign up for MyChart if you have not already done so. You will need a smartphone if possible. For patients that do not have this, we can still complete the visit using a regular telephone but do prefer a smartphone to enable video when possible. You may have a family member that lives with you that can help. If possible, we also ask that you have a blood pressure cuff and scale at home to measure your blood pressure, heart rate and weight prior to your scheduled appointment. Patients with clinical needs that need an in-person evaluation and testing will still be able to come to the office if absolutely necessary. If you have any questions, feel free to call our office.     YOUR PROVIDER WILL BE USING THE FOLLOWING PLATFORM TO COMPLETE YOUR VISIT: Doximity  . IF USING MYCHART - How to Download the MyChart App to Your SmartPhone   - If Apple, go to App Store and type in MyChart in the search bar and download the app. If Android, ask patient to go to Google Play Store and type in MyChart in the search bar and download the app. The app is free but as with any other app downloads, your phone may require you to verify saved payment information or Apple/Android password.  - You will need to then log into the app with your MyChart username and password, and select [facility] as your healthcare provider to link the account.  - When it is time for your visit, go to the MyChart app, find appointments, and click Begin Video Visit. Be sure to Select Allow for your device to  access the Microphone and Camera for your visit. You will then be connected, and your provider will be with you shortly.  **If you have any issues connecting or need assistance, please contact MyChart service desk [phone] ([phone])**  **If using a computer, in order to ensure the best quality for your visit, you will need to use either of the following Internet Browsers: Google Chrome or Microsoft Edge**  . IF USING DOXIMITY or [email or web address].ME - The staff will give you instructions on receiving your link to join the meeting the day of your visit.      2-3 DAYS BEFORE YOUR APPOINTMENT  You will receive a telephone call from one of our Heart[facility] team members - your caller ID may say "Unknown caller." If this is a video visit, we will walk you through how to get the video launched on your phone. We will remind you check your blood pressure, heart rate and weight prior to your scheduled appointment. If you have an Apple Watch or Kardia, please upload any pertinent ECG strips the day before or morning of your appointment to MyChart. Our staff will also make sure you have reviewed the consent and agree to move forward with your scheduled tele-health visit.     THE DAY OF YOUR APPOINTMENT  Approximately 15 minutes prior to your scheduled appointment, you will receive a telephone call from one of HeartCare team - your caller ID may say "Unknown caller."    Our staff will confirm medications, vital signs for the day and any symptoms you may be experiencing. Please have this information available prior to the time of visit start. It may also be helpful for you to have a pad of paper and pen handy for any instructions given during your visit. They will also walk you through joining the smartphone meeting if this is a video visit.    CONSENT FOR TELE-HEALTH VISIT - PLEASE REVIEW  I hereby voluntarily request, consent and authorize [facility] and its employed or contracted physicians, physician  assistants, nurse practitioners or other licensed health care professionals (the Practitioner), to provide me with telemedicine health care services (the "Services") as deemed necessary by the treating Practitioner. I acknowledge and consent to receive the Services by the Practitioner via telemedicine. I understand that the telemedicine visit will involve communicating with the Practitioner through live audiovisual communication technology and the disclosure of certain medical information by electronic transmission. I acknowledge that I have been given the opportunity to request an in-person assessment or other available alternative prior to the telemedicine visit and am voluntarily participating in the telemedicine visit.  I understand that I have the right to withhold or withdraw my consent to the use of telemedicine in the course of my care at any time, without affecting my right to future care or treatment, and that the Practitioner or I may terminate the telemedicine visit at any time. I understand that I have the right to inspect all information obtained and/or recorded in the course of the telemedicine visit and may receive copies of available information for a reasonable fee.  I understand that some of the potential risks of receiving the Services via telemedicine include:  . Delay or interruption in medical evaluation due to technological equipment failure or disruption; . Information transmitted may not be sufficient (e.g. poor resolution of images) to allow for appropriate medical decision making by the Practitioner; and/or  . In rare instances, security protocols could fail, causing a breach of personal health information.  Furthermore, I acknowledge that it is my responsibility to provide information about my medical history, conditions and care that is complete and accurate to the best of my ability. I acknowledge that Practitioner's advice, recommendations, and/or decision may be based on  factors not within their control, such as incomplete or inaccurate data provided by me or distortions of diagnostic images or specimens that may result from electronic transmissions. I understand that the practice of medicine is not an exact science and that Practitioner makes no warranties or guarantees regarding treatment outcomes. I acknowledge that I will receive a copy of this consent concurrently upon execution via email to the email address I last provided but may also request a printed copy by calling the office of [facility].    I understand that my insurance will be billed for this visit.   I have read or had this consent read to me. . I understand the contents of this consent, which adequately explains the benefits and risks of the Services being provided via telemedicine.  . I have been provided ample opportunity to ask questions regarding this consent and the Services and have had my questions answered to my satisfaction. . I give my informed consent for the services to be provided through the use of telemedicine in my medical care  By participating in this telemedicine visit I agree to the above.  

## 2018-09-14 NOTE — Progress Notes (Signed)
Virtual Visit via Telephone Note   This visit type was conducted due to national recommendations for restrictions regarding the COVID-19 Pandemic (e.g. social distancing) in an effort to limit this patient's exposure and mitigate transmission in our community.  Due to her co-morbid illnesses, this patient is at least at moderate risk for complications without adequate follow up.  This format is felt to be most appropriate for this patient at this time.  The patient did not have access to video technology/had technical difficulties with video requiring transitioning to audio format only (telephone).  All issues noted in this document were discussed and addressed.  No physical exam could be performed with this format.  Please refer to the patient's chart for her  consent to [facility].   Date:  09/15/2018   ID:  Hall BusingIrma P Lee, DOB 02-16-1951, MRN [id number]  Patient Location: Home Provider Location: Home  PCP:  Renford DillsPolite, Ronald, MD  Cardiologist:  Dr. Katrinka BlazingSmith, MD   Evaluation Performed:  Follow-Up Visit  Chief Complaint: 6 month follow for NICM, seen for Dr. Katrinka BlazingSmith.   History of Present Illness:    Oneita Krasrma P Laural BenesJohnson is a 68 y.o. female with a hx of NICM with EF of 30 to 35% in 2017, HTN and hypothyroidism.   She last saw Dr. Katrinka BlazingSmith 11/2015 at which time Sherryll Burgerntresto was started. She was also referred to he HTN clinic and was seen in 02/2016.She was hesitate about starting more medications for HF in the absence of symptoms.   Ms. Laural BenesJohnson was last seen in follow up on 03/21/2018. She had cancelled her last few appointments 2017-2018 due to travel. She had no c/o chest pain. Last weight 176lb.   Today, she reports that she has been feeling great.  She has had no HF symptoms  including, shortness of breath, weight gain, LE swelling or orthopnea.  She had a repeat echocardiogram after her last office visit which showed a stable but continued depressed LV function at 30 to 35%.  We  discussed titrating her and Sherryll Burgerntresto however she feels that given that she is asymptomatic this is not necessary at this time.  Additionally, per chart review there was discussion about an EP referral for possible ICD and again she has declined.  We discussed monitoring closely for signs as above and she is fully aware and agreeable that if she begins to show significant HF signs, at that time she would be open for an alternate approach.  She has been walking in her neighborhood for exercise without complication.  She is very cautious about COVID-19 and does not feel comfortable coming to the office for lab work.  She had a virtual visit with her PCP last month and their plan was to come in several months for lab work.  I have asked her to have those labs forwarded to our office.  She does report that she stopped taking her Lasix 40 mg twice daily and feels improved off of this. Her weight is actually 2 pounds less than her last office visit at 174lb.   The patient does not have symptoms concerning for COVID-19 infection (fever, chills, cough, or new shortness of breath).   Past Medical History:  Diagnosis Date  . CHF (congestive heart failure) (HCC)   . Combined congestive systolic and diastolic heart failure (HCC)    Asymptomatic  . Essential hypertension, benign    Poor control related to weight gain  . Insomnia   . Kidney problem 09/2011  Cystic structure upper pole right kidney question scarring CT 09/2011  . Low back pain   . Major depressive disorder, recurrent episode, unspecified   . Non-ischemic cardiomyopathy (HCC)    EF 45% by ECHO 2012  . Obesity, unspecified   . Peripheral neuropathy   . Renal calculus    9 x 6 x 9 mm by CT 09/2011  . Sinoatrial node dysfunction (HCC)    Sinus bradycardia, asymptomatic  . 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 Unspecified hypothyroidism    Past Surgical History:  Procedure  Laterality Date  . CHOLECYSTECTOMY  1982  . ELBOW SURGERY Right      Current Meds  Medication Sig  . aspirin 81 MG tablet Take 81 mg by mouth daily.  . carvedilol (COREG) 25 MG tablet TAKE 1 TABLET BY MOUTH TWICE DAILY WITH A MEAL. Please schedule appointment  . sacubitril-valsartan (ENTRESTO) 49-51 MG Take 1 tablet by mouth 2 (two) times daily.  Marland Kitchen zolpidem (AMBIEN) 5 MG tablet Take 1 tablet (5 mg total) by mouth at bedtime as needed for sleep.     Allergies:   Patient has no known allergies.   Social History   Tobacco Use  . Smoking status: Never Smoker  . Smokeless tobacco: Never Used  Substance Use Topics  . Alcohol use: No    Alcohol/week: 0.0 standard drinks  . Drug use: No    Family Hx: The patient's family history includes Alzheimer's disease in her mother; Cancer in her father; Diabetes in her brother, brother, father, and mother; Healthy in her brother, brother, and brother; Hypertension in her brother, sister, and sister; Thyroid disease in her sister.  ROS:   Please see the history of present illness.     All other systems reviewed and are negative.  Prior CV studies:   The following studies were reviewed today:  Echocardiogram 03/2018: Study Conclusions  - Left ventricle: The cavity size was normal. Wall thickness was   increased in a pattern of mild LVH. The estimated ejection   fraction was 35%. Diffuse hypokinesis. Doppler parameters are   consistent with abnormal left ventricular relaxation (grade 1   diastolic dysfunction). - Aortic valve: There was no stenosis. - Mitral valve: Mildly calcified annulus. There was trivial   regurgitation. - Right ventricle: The cavity size was normal. Systolic function   was mildly reduced. - Pulmonary arteries: No complete TR doppler jet so unable to   estimate PA systolic pressure. - Systemic veins: IVC not visualized.  Impressions:  - Normal LV size with mild LV hypertrophy. EF 35%, diffuse   hypokinesis.  Normal RV size with mildly decreased systolic   function. No significant valvular abnormalities.  Echocardiogram May 2017 ----------------------------------------------------------------  - Left ventricle: The cavity size was normal. Wall thickness was increased in a pattern of moderate LVH. Systolic function was moderately to severely reduced. The estimated ejection fraction was in the range of 30% to 35%. Features are consistent with a pseudonormal left ventricular filling pattern, with concomitant abnormal relaxation and increased filling pressure (grade 2 diastolic dysfunction). - Mitral valve: There was mild regurgitation. - Left atrium: The atrium was moderately dilated.  Labs/Other Tests and Data Reviewed:    EKG:  An ECG dated 03/21/2018 was personally reviewed today and demonstrated:  NSR HR 64  Recent Labs: No results found for requested labs within last 8760 hours.  Recent Lipid Panel No results found for: CHOL, TRIG,  HDL, CHOLHDL, LDLCALC, LDLDIRECT  Wt Readings from Last 3 Encounters:  09/15/18 174 lb 8 oz (79.2 kg)  03/21/18 176 lb 12.8 oz (80.2 kg)  12/24/15 180 lb 6.4 oz (81.8 kg)    Objective:    Vital Signs:  BP 124/85   Pulse 62   Ht 5\' 4"  (1.626 m)   Wt 174 lb 8 oz (79.2 kg)   BMI 29.95 kg/m    VITAL SIGNS:  reviewed GEN:  no acute distress RESPIRATORY:  normal respiratory effort, symmetric expansion NEURO:  alert and oriented x 3, no obvious focal deficit PSYCH:  normal affect  ASSESSMENT & PLAN:    1. Chronic systolic HF/NICM: -Echocardiogram from 2017 with LVEF of 30-35%>>>repeat echo from 03/2018 with continued depressed EF at 35%. Per chart review, plan was EP referral for possible ICD placement>>pt declined further workup  -No HF symptoms>>still exercising  -Continue Entresto 49-51, carvedilol 25, ASA 81 -Lasix changed to PRN for weight gain and/or swelling -PCP to forward labs to our office   2. HTN: -Stable, 124/85  -Continue Entresto 49-51, carvedilol 25 -Lasix as needed as above per patient request    COVID-19 Education: The signs and symptoms of COVID-19 were discussed with the patient and how to seek care for testing (follow up with PCP or arrange E-visit). The importance of social distancing was discussed today.  Time:   Today, I have spent 20 minutes with the patient with telehealth technology discussing the above problems.     Medication Adjustments/Labs and Tests Ordered: Current medicines are reviewed at length with the patient today.  Concerns regarding medicines are outlined above.   Tests Ordered: No orders of the defined types were placed in this encounter.   Medication Changes: No orders of the defined types were placed in this encounter.   Disposition:  Follow up Dr. Katrinka Blazing in 6 months or sooner if needed  Signed, Georgie Chard, NP  09/15/2018 1:30 PM    [facility]

## 2018-09-15 ENCOUNTER — Other Ambulatory Visit: Payer: Self-pay

## 2018-09-15 ENCOUNTER — Telehealth (INDEPENDENT_AMBULATORY_CARE_PROVIDER_SITE_OTHER): Payer: Medicare Other | Admitting: Cardiology

## 2018-09-15 VITALS — BP 124/85 | HR 62 | Ht 64.0 in | Wt 174.5 lb

## 2018-09-15 DIAGNOSIS — I10 Essential (primary) hypertension: Secondary | ICD-10-CM

## 2018-09-15 DIAGNOSIS — I428 Other cardiomyopathies: Secondary | ICD-10-CM

## 2018-09-15 MED ORDER — FUROSEMIDE 40 MG PO TABS: 40.0000 mg | ORAL_TABLET | ORAL | 3 refills | Status: DC | PRN

## 2018-09-15 NOTE — Patient Instructions (Signed)
Medication Instructions:  CHANGE: Lasix to 40 mg only as needed for wt gain (2lbs overnight or 5lbs in one week)  If you need a refill on your cardiac medications before your next appointment, please call your pharmacy.   Lab work: FUTURE: BMET to be done by your primary care doctor in 1-2 months   If you have labs (blood work) drawn today and your tests are completely normal, you will receive your results only by: Marland Kitchen MyChart Message (if you have MyChart) OR . A paper copy in the mail If you have any lab test that is abnormal or we need to change your treatment, we will call you to review the results.  Testing/Procedures: None  Follow-Up: At [facility], you and your health needs are our priority.  As part of our continuing mission to provide you with exceptional heart care, we have created designated Provider Care Teams.  These Care Teams include your primary Cardiologist (physician) and Advanced Practice Providers (APPs -  Physician Assistants and Nurse Practitioners) who all work together to provide you with the care you need, when you need it. You will need a follow up appointment in 6 months.  Please call our office 2 months in advance to schedule this appointment.  You may see Dr. Katrinka Blazing  or one of the following Advanced Practice Providers on your designated Care Team:   Norma Fredrickson, NP Nada Boozer, NP . Georgie Chard, NP  Any Other Special Instructions Will Be Listed Below (If Applicable).

## 2019-04-07 ENCOUNTER — Telehealth: Payer: Self-pay | Admitting: *Deleted

## 2019-04-07 NOTE — Telephone Encounter (Signed)
   [facility] Pre-operative Risk Assessment    Request for surgical clearance:  1. What type of surgery is being performed?  1 SINGE EXTRACTION #29   2. When is this surgery scheduled?  TBD PENDING CLEARANCE  3. What type of clearance is required (medical clearance vs. Pharmacy clearance to hold med vs. Both)?  MEDICAL  4. Are there any medications that need to be held prior to surgery and how long? PT IS ON ASPIRIN BUT PER SURGICAL CLEARANCE, PT IS ON ASPIRIN  BUT THE SURGEON IS NOT REQUESTING PT TO HOLD ANY MEDICATIONS, IT IS UP TO THE PRESCRIBING DR  5. Practice name and name of physician performing surgery?  [facility]   6. What is your office phone number [phone]    7.   What is your office fax number [phone]  8.   Anesthesia type (None, local, MAC, general) ?  GENERAL USING VERSED, FENTANYL, & PROPOFOL   Jeanann Lewandowsky 04/07/2019, 8:30 AM  _________________________________________________________________   (provider comments below)

## 2019-04-07 NOTE — Telephone Encounter (Signed)
Last office note was faxed.   Please let them know we do not have recent labs. Last office visit was telehealth.   Please ask them to reach out to her PCP for labs.

## 2019-04-07 NOTE — Telephone Encounter (Signed)
   Provider requesting last office note and labs Any questions please call [phone] Fax: [phone]

## 2019-04-07 NOTE — Telephone Encounter (Signed)
   Primary Cardiologist: Sinclair Grooms, MD  Chart reviewed as part of pre-operative protocol coverage. Simple dental extractions are considered low risk procedures per guidelines and generally do not require any specific cardiac clearance. It is also generally accepted that for simple extractions and dental cleanings, there is no need to interrupt blood thinner therapy.   I will route this recommendation to the requesting party via Epic fax function and remove from pre-op pool.  Please call with questions.  Ledora Bottcher, PA 04/07/2019, 9:37 AM

## 2019-04-23 ENCOUNTER — Other Ambulatory Visit: Payer: Self-pay | Admitting: Nurse Practitioner

## 2019-08-11 ENCOUNTER — Telehealth: Payer: Self-pay | Admitting: *Deleted

## 2019-08-11 NOTE — Telephone Encounter (Signed)
  Patient Consent for Virtual Visit         Katie Lee has provided verbal consent on 08/11/2019 for a virtual visit (video or telephone).   CONSENT FOR VIRTUAL VISIT FOR:  Katie Lee  By participating in this virtual visit I agree to the following:  I hereby voluntarily request, consent and authorize [facility] and its employed or contracted physicians, physician assistants, nurse practitioners or other licensed health care professionals (the Practitioner), to provide me with telemedicine health care services (the "Services") as deemed necessary by the treating Practitioner. I acknowledge and consent to receive the Services by the Practitioner via telemedicine. I understand that the telemedicine visit will involve communicating with the Practitioner through live audiovisual communication technology and the disclosure of certain medical information by electronic transmission. I acknowledge that I have been given the opportunity to request an in-person assessment or other available alternative prior to the telemedicine visit and am voluntarily participating in the telemedicine visit.  I understand that I have the right to withhold or withdraw my consent to the use of telemedicine in the course of my care at any time, without affecting my right to future care or treatment, and that the Practitioner or I may terminate the telemedicine visit at any time. I understand that I have the right to inspect all information obtained and/or recorded in the course of the telemedicine visit and may receive copies of available information for a reasonable fee.  I understand that some of the potential risks of receiving the Services via telemedicine include:  Marland Kitchen Delay or interruption in medical evaluation due to technological equipment failure or disruption; . Information transmitted may not be sufficient (e.g. poor resolution of images) to allow for appropriate medical decision making by the Practitioner;  and/or  . In rare instances, security protocols could fail, causing a breach of personal health information.  Furthermore, I acknowledge that it is my responsibility to provide information about my medical history, conditions and care that is complete and accurate to the best of my ability. I acknowledge that Practitioner's advice, recommendations, and/or decision may be based on factors not within their control, such as incomplete or inaccurate data provided by me or distortions of diagnostic images or specimens that may result from electronic transmissions. I understand that the practice of medicine is not an exact science and that Practitioner makes no warranties or guarantees regarding treatment outcomes. I acknowledge that a copy of this consent can be made available to me via my patient portal [facility] MyChart), or I can request a printed copy by calling the office of [facility].    I understand that my insurance will be billed for this visit.   I have read or had this consent read to me. . I understand the contents of this consent, which adequately explains the benefits and risks of the Services being provided via telemedicine.  . I have been provided ample opportunity to ask questions regarding this consent and the Services and have had my questions answered to my satisfaction. . I give my informed consent for the services to be provided through the use of telemedicine in my medical care

## 2019-08-11 NOTE — Progress Notes (Signed)
Virtual Visit via Telephone Note   This visit type was conducted due to national recommendations for restrictions regarding the COVID-19 Pandemic (e.g. social distancing) in an effort to limit this patient's exposure and mitigate transmission in our community.  Due to her co-morbid illnesses, this patient is at least at moderate risk for complications without adequate follow up.  This format is felt to be most appropriate for this patient at this time.  The patient did not have access to video technology/had technical difficulties with video requiring transitioning to audio format only (telephone).  All issues noted in this document were discussed and addressed.  No physical exam could be performed with this format.  Please refer to the patient's chart for her  consent to telehealth for [facility]   The patient was identified using 2 identifiers.  Date:  08/11/2019   ID:  Katie Lee, DOB Jan 31, 1951, MRN [id number]  Patient Location: Home Provider Location: Home  PCP:  Renford Dills, MD  Cardiologist:  Lesleigh Noe, MD  Electrophysiologist:  None   Evaluation Performed:  Follow-Up Visit  Chief Complaint:  6 month follow for NICM, seen for Dr. Katrinka Blazing.   History of Present Illness:    Katie Lee is a 69 y.o. female with with a hx of NICM with EF of 30 to 35% in 2017, HTN and hypothyroidism.  She last saw Dr. Katrinka Blazing 11/2015 at which time Sherryll Burger was started. She was also referred to he HTN clinic and was seen in 02/2016.She was hesitate about starting more medications for HF in the absence of symptoms.   She was last seen by myself 09/15/2018 and was doing well from a CV standpoint with no HF symptoms including, shortness of breath, weight gain, LE swelling or orthopnea.  She had a repeat echocardiogram after her last office visit which showed a stable but continued depressed LV function at 30 to 35%. Additionally, per chart review there was discussion about an EP  referral for possible ICD and again she has declined. Her weight is actually 2 pounds less than her last office visit at 174lb.   Today Ms. Doepke reports that she has been doing very well from a CV standpoint.  Has recently started an exercise program without any concern.  Has not needed her as needed Lasix in approximately 1-1/2 years.  Has no chest pain, orthopnea, LE edema, shortness of breath, palpitations, dizziness or syncope.  Has been compliant with her Entresto.  Given the duration of time since last echocardiogram, discussed repeating this with follow-up lab work.  Patient is in agreement.  The patient does not have symptoms concerning for COVID-19 infection (fever, chills, cough, or new shortness of breath).   Past Medical History:  Diagnosis Date  . CHF (congestive heart failure) (HCC)   . Combined congestive systolic and diastolic heart failure (HCC)    Asymptomatic  . Essential hypertension, benign    Poor control related to weight gain  . Insomnia   . Kidney problem 09/2011   Cystic structure upper pole right kidney question scarring CT 09/2011  . Low back pain   . Major depressive disorder, recurrent episode, unspecified   . Non-ischemic cardiomyopathy (HCC)    EF 45% by ECHO 2012  . Obesity, unspecified   . Peripheral neuropathy   . Renal calculus    9 x 6 x 9 mm by CT 09/2011  . Sinoatrial node dysfunction (HCC)    Sinus bradycardia, asymptomatic  . 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 Unspecified hypothyroidism    Past Surgical History:  Procedure Laterality Date  . CHOLECYSTECTOMY  1982  . ELBOW SURGERY Right      No outpatient medications have been marked as taking for the 08/14/19 encounter (Appointment) with Filbert Schilder, NP.     Allergies:   Patient has no known allergies.   Social History   Tobacco Use  . Smoking status: Never Smoker  . Smokeless tobacco: Never Used  Substance  Use Topics  . Alcohol use: No    Alcohol/week: 0.0 standard drinks  . Drug use: No     Family Hx: The patient's family history includes Alzheimer's disease in her mother; Cancer in her father; Diabetes in her brother, brother, father, and mother; Healthy in her brother, brother, and brother; Hypertension in her brother, sister, and sister; Thyroid disease in her sister.  ROS:   Please see the history of present illness.     All other systems reviewed and are negative.  Prior CV studies:   The following studies were reviewed today:  Echocardiogram 03/2018: Study Conclusions  - Left ventricle: The cavity size was normal. Wall thickness was increased in a pattern of mild LVH. The estimated ejection fraction was 35%. Diffuse hypokinesis. Doppler parameters are consistent with abnormal left ventricular relaxation (grade 1 diastolic dysfunction). - Aortic valve: There was no stenosis. - Mitral valve: Mildly calcified annulus. There was trivial regurgitation. - Right ventricle: The cavity size was normal. Systolic function was mildly reduced. - Pulmonary arteries: No complete TR doppler jet so unable to estimate PA systolic pressure. - Systemic veins: IVC not visualized.  Impressions:  - Normal LV size with mild LV hypertrophy. EF 35%, diffuse hypokinesis. Normal RV size with mildly decreased systolic function. No significant valvular abnormalities.  Echocardiogram May 2017 ----------------------------------------------------------------  - Left ventricle: The cavity size was normal. Wall thickness was increased in a pattern of moderate LVH. Systolic function was moderately to severely reduced. The estimated ejection fraction was in the range of 30% to 35%. Features are consistent with a pseudonormal left ventricular filling pattern, with concomitant abnormal relaxation and increased filling pressure (grade 2 diastolic dysfunction). -  Mitral valve: There was mild regurgitation. - Left atrium: The atrium was moderately dilated.  Labs/Other Tests and Data Reviewed:    EKG:    Recent Labs: No results found for requested labs within last 8760 hours.   Recent Lipid Panel No results found for: CHOL, TRIG, HDL, CHOLHDL, LDLCALC, LDLDIRECT  Wt Readings from Last 3 Encounters:  09/15/18 174 lb 8 oz (79.2 kg)  03/21/18 176 lb 12.8 oz (80.2 kg)  12/24/15 180 lb 6.4 oz (81.8 kg)     Objective:    Vital Signs:  There were no vitals taken for this visit.   VITAL SIGNS:  reviewed GEN:  no acute distress NEURO:  alert and oriented x 3, no obvious focal deficit PSYCH:  normal affect  ASSESSMENT & PLAN:    1. Chronic systolic HF/NICM: -Echocardiogram from 2017 with LVEF of 30-35%>>>repeat echo from 03/2018 with continued depressed EF at 35%. Per chart review, plan was EP referral for possible ICD placement>>pt declined further workup  -No HF symptoms>>still exercising regularly   -Continue Entresto 49-51, carvedilol 25, ASA 81 -Lasix changed to PRN for weight gain and/or swelling>>not needed  -Repeat echocardiogram  2. HTN: -Stable, 141/78 -Continue Entresto 49-51, carvedilol 25 -Continue PRN Lasix>>>however has not  needed in quite some time.    COVID-19 Education: The signs and symptoms of COVID-19 were discussed with the patient and how to seek care for testing (follow up with PCP or arrange E-visit). The importance of social distancing was discussed today.  Time: Today, I have spent 20 minutes with the patient with telehealth technology discussing the above problems.     Medication Adjustments/Labs and Tests Ordered: Current medicines are reviewed at length with the patient today.  Concerns regarding medicines are outlined above.   Tests Ordered: No orders of the defined types were placed in this encounter.   Medication Changes: No orders of the defined types were placed in this encounter.   Follow  Up:  Either In Person or Virtual Dr. Tamala Julian or myself in 6-9 months   Signed, Kathyrn Drown, NP  08/11/2019 4:05 PM    [place]

## 2019-08-14 ENCOUNTER — Other Ambulatory Visit: Payer: Self-pay

## 2019-08-14 ENCOUNTER — Telehealth (INDEPENDENT_AMBULATORY_CARE_PROVIDER_SITE_OTHER): Payer: Medicare Other | Admitting: Cardiology

## 2019-08-14 ENCOUNTER — Encounter: Payer: Self-pay | Admitting: Cardiology

## 2019-08-14 VITALS — BP 141/78 | Ht 64.0 in | Wt 174.0 lb

## 2019-08-14 DIAGNOSIS — I110 Hypertensive heart disease with heart failure: Secondary | ICD-10-CM

## 2019-08-14 DIAGNOSIS — I428 Other cardiomyopathies: Secondary | ICD-10-CM | POA: Diagnosis not present

## 2019-08-14 DIAGNOSIS — I5022 Chronic systolic (congestive) heart failure: Secondary | ICD-10-CM

## 2019-08-14 DIAGNOSIS — I10 Essential (primary) hypertension: Secondary | ICD-10-CM

## 2019-08-14 NOTE — Addendum Note (Signed)
Addended by: Lajoyce Corners on: 08/14/2019 09:57 AM   Modules accepted: Orders

## 2019-08-14 NOTE — Patient Instructions (Addendum)
Medication Instructions:   Your physician recommends that you continue on your current medications as directed. Please refer to the Current Medication list given to you today.  *If you need a refill on your cardiac medications before your next appointment, please call your pharmacy*  Lab Work:  Your physician recommends that you return for lab work on 09/11/19 for BMET  If you have labs (blood work) drawn today and your tests are completely normal, you will receive your results only by: Marland Kitchen MyChart Message (if you have MyChart) OR . A paper copy in the mail If you have any lab test that is abnormal or we need to change your treatment, we will call you to review the results.  Testing/Procedures:  Your physician has requested that you have an echocardiogram on 09/11/19. Echocardiography is a painless test that uses sound waves to create images of your heart. It provides your doctor with information about the size and shape of your heart and how well your heart's chambers and valves are working. This procedure takes approximately one hour. There are no restrictions for this procedure.  Follow-Up: At [facility], you and your health needs are our priority.  As part of our continuing mission to provide you with exceptional heart care, we have created designated Provider Care Teams.  These Care Teams include your primary Cardiologist (physician) and Advanced Practice Providers (APPs -  Physician Assistants and Nurse Practitioners) who all work together to provide you with the care you need, when you need it.  We recommend signing up for the patient portal called "MyChart".  Sign up information is provided on this After Visit Summary.  MyChart is used to connect with patients for Virtual Visits (Telemedicine).  Patients are able to view lab/test results, encounter notes, upcoming appointments, etc.  Non-urgent messages can be sent to your provider as well.   To learn more about what you can do with  MyChart, go to [email or web address].    Your next appointment:   6-9 month(s)  The format for your next appointment:   In Person  Provider:    You may see Lesleigh Noe, MD or Georgie Chard, NP

## 2019-09-11 ENCOUNTER — Ambulatory Visit (HOSPITAL_COMMUNITY): Payer: Medicare Other | Attending: Cardiology

## 2019-09-11 ENCOUNTER — Other Ambulatory Visit: Payer: Medicare Other

## 2019-09-11 ENCOUNTER — Other Ambulatory Visit: Payer: Self-pay

## 2019-09-11 DIAGNOSIS — I10 Essential (primary) hypertension: Secondary | ICD-10-CM

## 2019-09-11 DIAGNOSIS — I428 Other cardiomyopathies: Secondary | ICD-10-CM | POA: Insufficient documentation

## 2019-09-12 LAB — BASIC METABOLIC PANEL
BUN/Creatinine Ratio: 13 (ref 12–28)
BUN: 10 mg/dL (ref 8–27)
CO2: 26 mmol/L (ref 20–29)
Calcium: 9.5 mg/dL (ref 8.7–10.3)
Chloride: 102 mmol/L (ref 96–106)
Creatinine, Ser: 0.76 mg/dL (ref 0.57–1.00)
GFR calc Af Amer: 93 mL/min/{1.73_m2} (ref >59)
GFR calc non Af Amer: 80 mL/min/{1.73_m2} (ref >59)
Glucose: 97 mg/dL (ref 65–99)
Potassium: 4.6 mmol/L (ref 3.5–5.2)
Sodium: 140 mmol/L (ref 134–144)

## 2019-09-20 ENCOUNTER — Telehealth: Payer: Self-pay | Admitting: Cardiology

## 2019-09-20 NOTE — Telephone Encounter (Signed)
   Pt is calling back regarding echo results

## 2019-09-20 NOTE — Telephone Encounter (Signed)
The patient has been notified of the result and verbalized understanding.  All questions (if any) were answered. Lajoyce Corners, CMA 09/20/2019 10:06 AM

## 2020-01-24 ENCOUNTER — Ambulatory Visit: Payer: Self-pay

## 2020-02-24 ENCOUNTER — Ambulatory Visit (INDEPENDENT_AMBULATORY_CARE_PROVIDER_SITE_OTHER): Payer: Medicare Other

## 2020-02-24 ENCOUNTER — Ambulatory Visit (HOSPITAL_COMMUNITY)
Admission: EM | Admit: 2020-02-24 | Discharge: 2020-02-24 | Disposition: A | Discharge status: Home / Self Care | Payer: Medicare Other | Attending: Family Medicine | Admitting: Family Medicine

## 2020-02-24 ENCOUNTER — Encounter (HOSPITAL_COMMUNITY): Payer: Self-pay | Admitting: Emergency Medicine

## 2020-02-24 ENCOUNTER — Other Ambulatory Visit: Payer: Self-pay

## 2020-02-24 DIAGNOSIS — M79672 Pain in left foot: Secondary | ICD-10-CM

## 2020-02-24 DIAGNOSIS — S93602A Unspecified sprain of left foot, initial encounter: Secondary | ICD-10-CM | POA: Diagnosis not present

## 2020-02-24 DIAGNOSIS — W228XXA Striking against or struck by other objects, initial encounter: Secondary | ICD-10-CM

## 2020-02-24 NOTE — Discharge Instructions (Addendum)
Your xray was negative for any fractures or misalignments  Take ibuprofen and tylenol as needed for pain. May apply ice to the area  Follow up with orthopedics if symptoms are persisting

## 2020-02-24 NOTE — ED Provider Notes (Signed)
[facility]   [id number] 02/24/20 Arrival Time: 1354  UM:PNTIR PAIN  SUBJECTIVE: History from: patient. Katie Lee is a 69 y.o. female complains of left foot pain and swelling x 3 days. Reports that she kicked her desk at home in the dark. Describes the pain as intermittent and achy in character. Has tried OTC medications without relief. Symptoms are made worse with activity. Denies similar symptoms in the past.  Denies fever, chills, erythema, ecchymosis, effusion, weakness, numbness and tingling, saddle paresthesias, loss of bowel or bladder function.      ROS: As per HPI.  All other pertinent ROS negative.     Past Medical History:  Diagnosis Date  . CHF (congestive heart failure) (HCC)   . Combined congestive systolic and diastolic heart failure (HCC)    Asymptomatic  . Essential hypertension, benign    Poor control related to weight gain  . Insomnia   . Kidney problem 09/2011   Cystic structure upper pole right kidney question scarring CT 09/2011  . Low back pain   . Major depressive disorder, recurrent episode, unspecified   . Non-ischemic cardiomyopathy (HCC)    EF 45% by ECHO 2012  . Obesity, unspecified   . Peripheral neuropathy   . Renal calculus    9 x 6 x 9 mm by CT 09/2011  . Sinoatrial node dysfunction (HCC)    Sinus bradycardia, asymptomatic  . 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 Unspecified hypothyroidism    Past Surgical History:  Procedure Laterality Date  . CHOLECYSTECTOMY  1982  . ELBOW SURGERY Right    No Known Allergies No current facility-administered medications on file prior to encounter.   Current Outpatient Medications on File Prior to Encounter  Medication Sig Dispense Refill  . ascorbic acid (VITAMIN C) 500 MG tablet Take 500 mg by mouth daily.    Marland Kitchen aspirin 81 MG tablet Take 81 mg by mouth daily.    . carvedilol (COREG) 25 MG tablet TAKE 1 TABLET BY MOUTH  TWICE DAILY WITH A MEAL 180 tablet 3  . Cholecalciferol (VITAMIN D-3) 25 MCG (1000 UT) CAPS Take 1 capsule by mouth daily.    Marland Kitchen ENTRESTO 49-51 MG TAKE 1 TABLET BY MOUTH TWICE DAILY. 180 tablet 3  . furosemide (LASIX) 40 MG tablet Take 1 tablet (40 mg total) by mouth as needed (As needed for wt gain (2lbs overnight or 5lbs in one week)). 90 tablet 3  . zolpidem (AMBIEN) 5 MG tablet Take 1 tablet (5 mg total) by mouth at bedtime as needed for sleep. 7 tablet 0   Social History   Socioeconomic History  . Marital status: Divorced    Spouse name: Not on file  . Number of children: Not on file  . Years of education: Not on file  . Highest education level: Not on file  Occupational History  . Not on file  Tobacco Use  . Smoking status: Never Smoker  . Smokeless tobacco: Never Used  Substance and Sexual Activity  . Alcohol use: No    Alcohol/week: 0.0 standard drinks  . Drug use: No  . Sexual activity: Not on file  Other Topics Concern  . Not on file  Social History Narrative  . Not on file   Social Determinants of Health   Financial Resource Strain:   . Difficulty of Paying Living Expenses: Not on file  Food Insecurity:   . Worried About Running  Out of Food in the Last Year: Not on file  . Ran Out of Food in the Last Year: Not on file  Transportation Needs:   . Lack of Transportation (Medical): Not on file  . Lack of Transportation (Non-Medical): Not on file  Physical Activity:   . Days of Exercise per Week: Not on file  . Minutes of Exercise per Session: Not on file  Stress:   . Feeling of Stress : Not on file  Social Connections:   . Frequency of Communication with Friends and Family: Not on file  . Frequency of Social Gatherings with Friends and Family: Not on file  . Attends Religious Services: Not on file  . Active Member of Clubs or Organizations: Not on file  . Attends Banker Meetings: Not on file  . Marital Status: Not on file  Intimate Partner  Violence:   . Fear of Current or Ex-Partner: Not on file  . Emotionally Abused: Not on file  . Physically Abused: Not on file  . Sexually Abused: Not on file   Family History  Problem Relation Age of Onset  . Diabetes Mother   . Alzheimer's disease Mother   . Diabetes Father   . Cancer Father        prostate  . Hypertension Sister   . Thyroid disease Sister   . Diabetes Brother   . Diabetes Brother   . Hypertension Brother   . Hypertension Sister   . Healthy Brother   . Healthy Brother   . Healthy Brother     OBJECTIVE:  Vitals:   02/24/20 1438  BP: (!) 195/84  Pulse: 61  Resp: 17  Temp: 97.9 F (36.6 C)  TempSrc: Oral  SpO2: 98%    General appearance: ALERT; in no acute distress.  Head: NCAT Lungs: Normal respiratory effort CV: pulses 2+ bilaterally. Cap refill < 2 seconds Musculoskeletal:  Inspection: Skin warm, dry, clear and intact. Ecchymosis to 3rd and 4th toes on the R foot Palpation:R foot and toes tender to palpation ROM: limited ROM active and passive to R thumb and first and second fingers Skin: warm and dry Neurologic: Ambulates without difficulty; Sensation intact about the upper/ lower extremities Psychological: alert and cooperative; normal mood and affect  DIAGNOSTIC STUDIES:  DG Foot Complete Left  Result Date: 02/24/2020 CLINICAL DATA:  69 year old female with a history of foot pain after injury EXAM: LEFT FOOT - COMPLETE 3+ VIEW COMPARISON:  None. FINDINGS: No acute displaced fracture. No radiopaque foreign body. Degenerative changes of the interphalangeal joints. No subluxation/dislocation. Questionable soft tissue swelling on the dorsal forefoot. IMPRESSION: Negative for acute bony abnormality. Questionable mild soft tissue swelling on the dorsal forefoot Electronically Signed   By: Gilmer Mor D.O.   On: 02/24/2020 15:11     ASSESSMENT & PLAN:  1. Foot sprain, left, initial encounter   2. Left foot pain     xrays negative for  fractures or misalignments Continue conservative management of rest, ice, and gentle stretches Take ibuprofen as needed for pain relief (may cause abdominal discomfort, ulcers, and GI bleeds avoid taking with other NSAIDs) Follow up with PCP if symptoms persist Return or go to the ER if you have any new or worsening symptoms (fever, chills, chest pain, abdominal pain, changes in bowel or bladder habits, pain radiating into lower legs)   Reviewed expectations re: course of current medical issues. Questions answered. Outlined signs and symptoms indicating need for more acute intervention. Patient verbalized understanding.  After Visit Summary given.       Moshe Cipro, NP 02/24/20 1542

## 2020-02-24 NOTE — ED Triage Notes (Signed)
Pt presents with left toe pain xs 3 days. States "kicked" desk while walking and continues to throb with pain.

## 2020-04-17 ENCOUNTER — Other Ambulatory Visit: Payer: Self-pay | Admitting: Nurse Practitioner

## 2020-07-16 ENCOUNTER — Other Ambulatory Visit: Payer: Self-pay | Admitting: *Deleted

## 2020-07-16 MED ORDER — ENTRESTO 49-51 MG PO TABS
1.0000 | ORAL_TABLET | Freq: Two times a day (BID) | ORAL | 0 refills | Status: DC
Start: 2020-07-16 — End: 2020-08-26

## 2020-08-16 DIAGNOSIS — H2513 Age-related nuclear cataract, bilateral: Secondary | ICD-10-CM | POA: Diagnosis not present

## 2020-08-16 DIAGNOSIS — H35363 Drusen (degenerative) of macula, bilateral: Secondary | ICD-10-CM | POA: Diagnosis not present

## 2020-08-25 ENCOUNTER — Other Ambulatory Visit: Payer: Self-pay | Admitting: Interventional Cardiology

## 2020-08-27 DIAGNOSIS — H10023 Other mucopurulent conjunctivitis, bilateral: Secondary | ICD-10-CM | POA: Diagnosis not present

## 2020-10-13 ENCOUNTER — Other Ambulatory Visit: Payer: Self-pay | Admitting: Interventional Cardiology

## 2021-02-21 DIAGNOSIS — R739 Hyperglycemia, unspecified: Secondary | ICD-10-CM | POA: Diagnosis not present

## 2021-02-21 DIAGNOSIS — Z0000 Encounter for general adult medical examination without abnormal findings: Secondary | ICD-10-CM | POA: Diagnosis not present

## 2021-02-21 DIAGNOSIS — I10 Essential (primary) hypertension: Secondary | ICD-10-CM | POA: Diagnosis not present

## 2021-02-21 DIAGNOSIS — Z1231 Encounter for screening mammogram for malignant neoplasm of breast: Secondary | ICD-10-CM | POA: Diagnosis not present

## 2021-02-21 DIAGNOSIS — I428 Other cardiomyopathies: Secondary | ICD-10-CM | POA: Diagnosis not present

## 2021-02-21 DIAGNOSIS — G4700 Insomnia, unspecified: Secondary | ICD-10-CM | POA: Diagnosis not present

## 2021-02-27 DIAGNOSIS — Z1211 Encounter for screening for malignant neoplasm of colon: Secondary | ICD-10-CM | POA: Diagnosis not present

## 2021-03-13 ENCOUNTER — Other Ambulatory Visit: Payer: Self-pay

## 2021-03-13 MED ORDER — ENTRESTO 49-51 MG PO TABS
1.0000 | ORAL_TABLET | Freq: Two times a day (BID) | ORAL | 0 refills | Status: DC
Start: 2021-03-13 — End: 2021-03-28

## 2021-03-27 ENCOUNTER — Other Ambulatory Visit: Payer: Self-pay | Admitting: Interventional Cardiology

## 2021-03-28 MED ORDER — ENTRESTO 49-51 MG PO TABS: 1.0000 | ORAL_TABLET | Freq: Two times a day (BID) | ORAL | 0 refills | Status: DC

## 2021-03-28 NOTE — Addendum Note (Signed)
Addended by: Margaret Pyle D on: 03/28/2021 12:14 PM   Modules accepted: Orders

## 2021-05-05 ENCOUNTER — Encounter: Payer: Self-pay | Admitting: Physician Assistant

## 2021-05-05 NOTE — Progress Notes (Signed)
Cardiology Office Note    Date:  05/07/2021   ID:  Corky, Tanenbaum 11/22/1950, MRN [id number]  PCP:  Renford Dills, MD  Cardiologist:  Lesleigh Noe, MD  Electrophysiologist:  None   Chief Complaint: f/u NICM  History of Present Illness:   Katie Lee is a 71 y.o. female with history of NICM, chronic combined CHF, HTN, prior hypothyroidism, depression, sinus bradycardia who is seen back for follow-up, last OV 07/2019 via telemedicine.   She carries history of nonischemic cardiomyopathy with Dr. Michaelle Copas notes referencing LVEF 40% in 2012. No prior ischemic evaluation on file. The patient states this was originally diagnosed in the early 2000s following a viral infection. She states she had a catheterization in [facility] that did not show any coronary artery disease. Last echo 08/2019 EF 30-35%, global HK, moderate LVH, grade 1 DD, normal RV, mild LAE, mild MR. Notes indicate she was previously recommended to be referred for EP for possible ICD though patient declined further workup.  Today she reports she is doing extremely well without any recent CP, SOB, orthopnea, syncope or palpitations. She is being very cautious with Covid precautions. She is tolerating her medication regimen well. She reports home SBPs ranging in the 135-140 range predominantly. Lipids are followed in primary care per labs.  Labwork independently reviewed: [facility] 01/2021 HDL 57, LDL 122, trig 107, A1C 5.4, Hgb 11.5, Cr 0.750, K 3.7, ALT wnl, TSH wnl  08/2019 K 4.6, Cr 0.76   Cardiology Studies:   Studies reviewed are outlined and summarized above. Reports included below if pertinent.   2D echo 08/2019   1. Moderate to severe global reduction in LV systolic function; grade 1  diastolic dysfunction; moderate LVH; mild MR; mild LAE.   2. Left ventricular ejection fraction, by estimation, is 30 to 35%. The  left ventricle has moderate to severely decreased function. The left  ventricle demonstrates  global hypokinesis. There is moderate left  ventricular hypertrophy. Left ventricular  diastolic parameters are consistent with Grade I diastolic dysfunction  (impaired relaxation).   3. Right ventricular systolic function is normal. The right ventricular  size is normal. Tricuspid regurgitation signal is inadequate for assessing  PA pressure.   4. Left atrial size was mildly dilated.   5. The mitral valve is normal in structure. Mild mitral valve  regurgitation. No evidence of mitral stenosis.   6. The aortic valve is tricuspid. Aortic valve regurgitation is not  visualized. No aortic stenosis is present.   7. The inferior vena cava is normal in size with greater than 50%  respiratory variability, suggesting right atrial pressure of 3 mmHg.     Past Medical History:  Diagnosis Date   Combined congestive systolic and diastolic heart failure (HCC)    Essential hypertension, benign    Poor control related to weight gain   Insomnia    Kidney problem 09/26/2011   Cystic structure upper pole right kidney question scarring CT 09/2011   Low back pain    Major depressive disorder, recurrent episode, unspecified    Mild mitral regurgitation    Non-ischemic cardiomyopathy (HCC)    EF 45% by ECHO 2012   Obesity, unspecified    Peripheral neuropathy    Renal calculus    9 x 6 x 9 mm by CT 09/2011   Sinoatrial node dysfunction (HCC)    Sinus bradycardia, asymptomatic   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   aspirin 81 MG tablet Take 81 mg by mouth daily.   Beta Carotene (VITAMIN A) 25000 UNIT capsule Take 25,000 Units by mouth daily.   carvedilol (COREG) 25 MG tablet  TAKE 1 TABLET BY MOUTH TWICE DAILY WITH A MEAL   Cholecalciferol (VITAMIN D-3) 25 MCG (1000 UT) CAPS Take 1 capsule by mouth daily.   ELDERBERRY PO Take by mouth daily.   sacubitril-valsartan (ENTRESTO) 49-51 MG Take 1 tablet by mouth 2 (two) times daily. Please keep upcoming appt in January 2023 with Cardiologist before anymore refills. Thank you   Zinc 100 MG TABS Take by mouth every other day.   zolpidem (AMBIEN) 5 MG tablet Take 1 tablet (5 mg total) by mouth at bedtime as needed for sleep.   Allergies:   Patient has no known allergies.   Social History   Socioeconomic History   Marital status: Divorced    Spouse name: Not on file   Number of children: Not on file   Years of education: Not on file   Highest education level: Not on file  Occupational History   Not on file  Tobacco Use   Smoking status: Never   Smokeless tobacco: Never  Substance and Sexual Activity   Alcohol use: No    Alcohol/week: 0.0 standard drinks   Drug use: No   Sexual activity: Not on file  Other Topics Concern   Not on file  Social History Narrative   Not on file   Social Determinants of Health   Financial Resource Strain: Not on file  Food Insecurity: Not on file  Transportation Needs: Not on file  Physical Activity: Not on file  Stress: Not on file  Social Connections: Not on file     Family History:  The patient's family history includes Alzheimer's disease in her mother; Cancer in her father; Diabetes in her brother, brother, father, and mother; Healthy in her brother, brother, and brother; Hypertension in her brother, sister, and sister; Thyroid disease in her sister.  ROS:   Please see the history of present illness.  All other systems are reviewed and otherwise negative.    EKG(s)/Additional Labs   EKG:  EKG is ordered today, personally reviewed, demonstrating SB 57bpm, no acute STT changes, QTc   Recent Labs: No results found for requested labs within last 8760 hours.   Recent Lipid Panel No results found for: CHOL, TRIG, HDL, CHOLHDL, VLDL, LDLCALC, LDLDIRECT  PHYSICAL EXAM:    VS:  BP 130/82    Pulse (!) 57    Ht 5\' 4"  (1.626 m)    Wt 167 lb 3.2 oz (75.8 kg)    SpO2 98%    BMI 28.70 kg/m   BMI: Body mass index is 28.7 kg/m.  GEN: Well nourished, well developed female in no acute distress HEENT: normocephalic, atraumatic Neck: no JVD, carotid bruits, or masses Cardiac: reg rhythm, mid 50s c/w sinus bradycardia; no murmurs, rubs, or gallops, no edema  Respiratory:  clear to auscultation bilaterally, normal work of breathing GI: soft, nontender, nondistended, + BS MS: no deformity or atrophy Skin: warm and dry, no rash Neuro:  Alert and Oriented x 3, Strength and sensation  are intact, follows commands Psych: euthymic mood, full affect  Wt Readings from Last 3 Encounters:  05/07/21 167 lb 3.2 oz (75.8 kg)  08/14/19 174 lb (78.9 kg)  09/15/18 174 lb 8 oz (79.2 kg)     ASSESSMENT & PLAN:   1. Chronic combined CHF - clinically doing well without any evidence of volume overload on exam. She is also cautious to avoid Covid given her underlying condition. There is room to further optimize her HF regimen. I saw that in 2017 the pharmD team recommended addition of spironolactone but she declined. She is agreeable to re-trial today. We will add spironolactone 25mg  daily. Check BMET today and again in 8-10 days. We will check in with her how BP is running at that time. I will see her back in 3 months' time at which we can revisit whether to update her echocardiogram. I also revisited the conversation around ICD today and answered her questions to help clarify what this is for. She indicates she has not wished to proceed since she is feeling well, but says she would consider it if she begins to feel symptomatic. I did caution her that sometimes these arrhythmias occur without specific warning signs beforehand and a defibrillator acts as a "watchdog" to help treat  these potentially dangerous/fatal heart rhythms. She continues to decline at this time. I encouraged her to please reach out if she changes her mind and I would be happy to place a referral to our EP team to talk more. Reviewed 2g sodium restriction, 2L fluid restriction, daily weights with patient.  2. Essential HTN - BP suboptimally controlled by home readings. Follow with addition of spironolactone above.  3. Mild mitral regurgitation - follow-up echo would be due 3-5 years after prior echo, so no later than 08/2024. However, I anticipate that we will likely recheck her echocardiogram before that time to follow her heart failure. Plan to review timing at next OV.  4. Sinus bradycardia - asymptomatic, HR mid-upper 50s, tolerating carvedilol without difficulty. Follow clinically.     Disposition: F/u with me in 3 months.   Medication Adjustments/Labs and Tests Ordered: Current medicines are reviewed at length with the patient today.  Concerns regarding medicines are outlined above. Medication changes, Labs and Tests ordered today are summarized above and listed in the Patient Instructions accessible in Encounters.   Signed, 09/2024, PA-C  05/07/2021 3:23 PM    [facility] Phone: [phone]; Fax: [phone]

## 2021-05-07 ENCOUNTER — Ambulatory Visit (INDEPENDENT_AMBULATORY_CARE_PROVIDER_SITE_OTHER): Payer: Medicare Other | Admitting: Physician Assistant

## 2021-05-07 ENCOUNTER — Encounter: Payer: Self-pay | Admitting: Physician Assistant

## 2021-05-07 ENCOUNTER — Other Ambulatory Visit: Payer: Self-pay

## 2021-05-07 VITALS — BP 130/82 | HR 57 | Ht 64.0 in | Wt 167.2 lb

## 2021-05-07 DIAGNOSIS — R001 Bradycardia, unspecified: Secondary | ICD-10-CM | POA: Diagnosis not present

## 2021-05-07 DIAGNOSIS — I10 Essential (primary) hypertension: Secondary | ICD-10-CM | POA: Diagnosis not present

## 2021-05-07 DIAGNOSIS — I340 Nonrheumatic mitral (valve) insufficiency: Secondary | ICD-10-CM

## 2021-05-07 DIAGNOSIS — I5042 Chronic combined systolic (congestive) and diastolic (congestive) heart failure: Secondary | ICD-10-CM

## 2021-05-07 MED ORDER — SPIRONOLACTONE 25 MG PO TABS: 25.0000 mg | ORAL_TABLET | Freq: Every day | ORAL | 3 refills | Status: DC

## 2021-05-07 NOTE — Patient Instructions (Signed)
Medication Instructions:  Your physician has recommended you make the following change in your medication:   START Spironolactone 25 mg taking 1 daily  *If you need a refill on your cardiac medications before your next appointment, please call your pharmacy*   Lab Work: TODAY:  BMET  COME BACK ON 05/16/21 FOR A:  BMET,(you can come anytime between 7:15-5 on this day, you do not have to fast)  If you have labs (blood work) drawn today and your tests are completely normal, you will receive your results only by: MyChart Message (if you have MyChart) OR A paper copy in the mail If you have any lab test that is abnormal or we need to change your treatment, we will call you to review the results.   Testing/Procedures: None ordered   Follow-Up: At [facility], you and your health needs are our priority.  As part of our continuing mission to provide you with exceptional heart care, we have created designated Provider Care Teams.  These Care Teams include your primary Cardiologist (physician) and Advanced Practice Providers (APPs -  Physician Assistants and Nurse Practitioners) who all work together to provide you with the care you need, when you need it.  We recommend signing up for the patient portal called "MyChart".  Sign up information is provided on this After Visit Summary.  MyChart is used to connect with patients for Virtual Visits (Telemedicine).  Patients are able to view lab/test results, encounter notes, upcoming appointments, etc.  Non-urgent messages can be sent to your provider as well.   To learn more about what you can do with MyChart, go to [email or web address].    Your next appointment:   08/18/2021 arrive at 3:00 to see  The format for your next appointment:   In Person  Provider:       Other Instructions For patients with congestive heart failure, we give them these special instructions:  1. Follow a low-salt diet - you should be eating no more than 2,000mg  of  sodium per day. This does not necessarily just apply to the salt you put on top of prepared food, but the sodium already in food. Processed food, frozen meals, canned goods, deli meat, and bread can have a surprising amount of sodium per serving so be sure to track this daily. 2. Watch your fluid intake. In general, you should not be taking in more than 2 liters of fluid per day (close to 64 oz of fluid per day). This includes sources of water in foods like soup, coffee, tea, milk, etc. It's important to stay hydrated but NOT to excess. 2. Weigh yourself on the same scale at same time of day and keep a log. 3. Call your doctor: (Anytime you feel any of the following symptoms)  - 3lb weight gain overnight or 5lb within a few days - Shortness of breath, with or without a dry hacking cough  - Swelling in the hands, feet or stomach  - If you have to sleep on extra pillows at night in order to breathe   IT IS IMPORTANT TO LET YOUR DOCTOR KNOW EARLY ON IF YOU ARE HAVING SYMPTOMS SO WE CAN HELP YOU!    If you decide you would like to speak with our electrophysiology team about what having a defibrillator entails, please don't hesitate to give [place] a call or send a Mychart message and we will set up that referral.

## 2021-05-08 ENCOUNTER — Telehealth: Payer: Self-pay | Admitting: *Deleted

## 2021-05-08 ENCOUNTER — Other Ambulatory Visit: Payer: Medicare Other

## 2021-05-08 DIAGNOSIS — Z79899 Other long term (current) drug therapy: Secondary | ICD-10-CM

## 2021-05-08 LAB — BASIC METABOLIC PANEL
BUN/Creatinine Ratio: 15 (ref 12–28)
BUN: 13 mg/dL (ref 8–27)
CO2: 25 mmol/L (ref 20–29)
Calcium: 10 mg/dL (ref 8.7–10.3)
Chloride: 103 mmol/L (ref 96–106)
Creatinine, Ser: 0.86 mg/dL (ref 0.57–1.00)
Glucose: 101 mg/dL — ABNORMAL HIGH (ref 70–99)
Potassium: 5 mmol/L (ref 3.5–5.2)
Sodium: 141 mmol/L (ref 134–144)
eGFR: 72 mL/min/{1.73_m2} (ref >59)

## 2021-05-08 LAB — POTASSIUM: Potassium: 4.1 mmol/L (ref 3.5–5.2)

## 2021-05-08 NOTE — Telephone Encounter (Signed)
-----   Message from Laurann [place], [place] sent at 05/08/2021  8:42 AM EST ----- Please let pt know labs OK but potassium upper limits of normal. Would like to re-draw potassium only before we officially start spironolactone. So hold off on taking and bring in for repeat K only. Does not need full BMET. Sometimes this is upper normal due to hemolysis that occurs during the blood draw.

## 2021-05-15 ENCOUNTER — Other Ambulatory Visit: Payer: Medicare Other

## 2021-05-15 ENCOUNTER — Other Ambulatory Visit: Payer: Self-pay

## 2021-05-15 DIAGNOSIS — R001 Bradycardia, unspecified: Secondary | ICD-10-CM

## 2021-05-15 DIAGNOSIS — I340 Nonrheumatic mitral (valve) insufficiency: Secondary | ICD-10-CM

## 2021-05-15 DIAGNOSIS — I5042 Chronic combined systolic (congestive) and diastolic (congestive) heart failure: Secondary | ICD-10-CM | POA: Diagnosis not present

## 2021-05-15 DIAGNOSIS — I10 Essential (primary) hypertension: Secondary | ICD-10-CM

## 2021-05-15 LAB — BASIC METABOLIC PANEL
BUN/Creatinine Ratio: 8 — ABNORMAL LOW (ref 12–28)
BUN: 6 mg/dL — ABNORMAL LOW (ref 8–27)
CO2: 29 mmol/L (ref 20–29)
Calcium: 9.2 mg/dL (ref 8.7–10.3)
Chloride: 104 mmol/L (ref 96–106)
Creatinine, Ser: 0.76 mg/dL (ref 0.57–1.00)
Glucose: 133 mg/dL — ABNORMAL HIGH (ref 70–99)
Potassium: 4.6 mmol/L (ref 3.5–5.2)
Sodium: 139 mmol/L (ref 134–144)
eGFR: 84 mL/min/{1.73_m2} (ref >59)

## 2021-05-15 NOTE — Progress Notes (Signed)
Pt has been made aware of normal result and verbalized understanding.  jw   She states she is checking every 2 or 3 days and has been running 130's-140's

## 2021-05-16 ENCOUNTER — Other Ambulatory Visit: Payer: Medicare Other

## 2021-07-01 ENCOUNTER — Other Ambulatory Visit: Payer: Self-pay | Admitting: Interventional Cardiology

## 2021-07-02 MED ORDER — ENTRESTO 49-51 MG PO TABS: 1.0000 | ORAL_TABLET | Freq: Two times a day (BID) | ORAL | 3 refills | Status: DC

## 2021-08-16 NOTE — Progress Notes (Addendum)
? ?Cardiology Office Note   ? ?Date:  08/18/2021  ? ?ID:  SHARONLEE NINE, DOB 08-11-1950, MRN [id number] ? ?PCP:  Seward Carol, MD  ?Cardiologist:  Sinclair Grooms, MD  ?Electrophysiologist:  None  ? ?Chief Complaint: f/u CHF ? ?History of Present Illness:  ? ?Katie Lee is a 71 y.o. female with history of NICM, chronic combined CHF, mild MR, HTN, prior hypothyroidism, depression, baseline sinus bradycardia who is seen back for follow-up. ?  ?She carries history of nonischemic cardiomyopathy with Dr. Thompson Caul notes referencing LVEF 40% in 2012. No prior ischemic evaluation on file. The patient states this was originally diagnosed in the early 2000s following a viral infection. She states she had a catheterization in [facility] that did not show any coronary artery disease. Last echo 08/2019 EF 30-35%, global HK, moderate LVH, grade 1 DD, normal RV, mild LAE, mild MR. Notes indicate she was previously recommended to be referred for EP for possible ICD though patient declined further workup. When I met her in follow-up we added spironolactone with plan for continued med titration. ICD was discussed in depth but she wished to defer. ? ?She returns for follow-up today feeling great. No CP, SOB, edema, orthopnea, palpitations or syncope. She reports her BP has improved with the spironolactone, running mid 120s-132 at home, highest reading 361 systolic. We again revisited the concept of ICD. ? ? ?Labwork independently reviewed: ?05/15/21 K 4.6, Cr 0.76, glucose 133 ?KPN 01/2021 HDL 57, LDL 122, trig 107, A1C 5.4, Hgb 11.5, Cr 0.750, K 3.7, ALT wnl, TSH wnl  ? ? ?Cardiology Studies:  ? ?Studies reviewed are outlined and summarized above. Reports included below if pertinent.  ? ?2D echo 08/2019 ? ? 1. Moderate to severe global reduction in LV systolic function; grade 1  ?diastolic dysfunction; moderate LVH; mild MR; mild LAE.  ? 2. Left ventricular ejection fraction, by estimation, is 30 to 35%. The  ?left ventricle  has moderate to severely decreased function. The left  ?ventricle demonstrates global hypokinesis. There is moderate left  ?ventricular hypertrophy. Left ventricular  ?diastolic parameters are consistent with Grade I diastolic dysfunction  ?(impaired relaxation).  ? 3. Right ventricular systolic function is normal. The right ventricular  ?size is normal. Tricuspid regurgitation signal is inadequate for assessing  ?PA pressure.  ? 4. Left atrial size was mildly dilated.  ? 5. The mitral valve is normal in structure. Mild mitral valve  ?regurgitation. No evidence of mitral stenosis.  ? 6. The aortic valve is tricuspid. Aortic valve regurgitation is not  ?visualized. No aortic stenosis is present.  ? 7. The inferior vena cava is normal in size with greater than 50%  ?respiratory variability, suggesting right atrial pressure of 3 mmHg.   ? ? ?Past Medical History:  ?Diagnosis Date  ? Combined congestive systolic and diastolic heart failure ([place])   ? Essential hypertension, benign   ? Poor control related to weight gain  ? Insomnia   ? Kidney problem 09/26/2011  ? Cystic structure upper pole right kidney question scarring CT 09/2011  ? Low back pain   ? Major depressive disorder, recurrent episode, unspecified   ? Mild mitral regurgitation   ? Non-ischemic cardiomyopathy ([place])   ? EF 45% by ECHO 2012  ? Obesity, unspecified   ? Peripheral neuropathy   ? Renal calculus   ? 9 x 6 x 9 mm by CT 09/2011  ? Sinoatrial node dysfunction (HCC)   ? Sinus bradycardia, asymptomatic  ?  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 Unspecified hypothyroidism   ? ? ?Past Surgical History:  ?Procedure Laterality Date  ? CHOLECYSTECTOMY  1982  ? ELBOW SURGERY Right   ? ? ?Current Medications: ?Current Meds  ?Medication Sig  ? ascorbic acid (VITAMIN C) 500 MG tablet Take 500 mg by mouth daily.  ? aspirin 81 MG tablet Take 81 mg by mouth daily.  ? Beta Carotene (VITAMIN A) 25000  UNIT capsule Take 25,000 Units by mouth daily.  ? carvedilol (COREG) 25 MG tablet TAKE 1 TABLET BY MOUTH TWICE DAILY WITH A MEAL  ? Cholecalciferol (VITAMIN D-3) 25 MCG (1000 UT) CAPS Take 1 capsule by mouth daily.  ? ELDERBERRY PO Take by mouth daily.  ? sacubitril-valsartan (ENTRESTO) 49-51 MG Take 1 tablet by mouth 2 (two) times daily.  ? spironolactone (ALDACTONE) 25 MG tablet Take 1 tablet (25 mg total) by mouth daily.  ? Zinc 100 MG TABS Take by mouth every other day.  ? zolpidem (AMBIEN) 5 MG tablet Take 1 tablet (5 mg total) by mouth at bedtime as needed for sleep.  ? ?  ? ?Allergies:   Patient has no known allergies.  ? ?Social History  ? ?Socioeconomic History  ? Marital status: Divorced  ?  Spouse name: Not on file  ? Number of children: Not on file  ? Years of education: Not on file  ? Highest education level: Not on file  ?Occupational History  ? Not on file  ?Tobacco Use  ? Smoking status: Never  ? Smokeless tobacco: Never  ?Substance and Sexual Activity  ? Alcohol use: No  ?  Alcohol/week: 0.0 standard drinks  ? Drug use: No  ? Sexual activity: Not on file  ?Other Topics Concern  ? Not on file  ?Social History Narrative  ? Not on file  ? ?Social Determinants of Health  ? ?Financial Resource Strain: Not on file  ?Food Insecurity: Not on file  ?Transportation Needs: Not on file  ?Physical Activity: Not on file  ?Stress: Not on file  ?Social Connections: Not on file  ?  ? ?Family History:  ?The patient's family history includes Alzheimer's disease in her mother; Cancer in her father; Diabetes in her brother, brother, father, and mother; Healthy in her brother, brother, and brother; Hypertension in her brother, sister, and sister; Thyroid disease in her sister. ? ?ROS:   ?Please see the history of present illness.  ?All other systems are reviewed and otherwise negative.  ? ? ?EKG(s)/Additional Labs  ? ?EKG:  EKG is not ordered today. ? ?Recent Labs: ?05/15/2021: BUN 6; Creatinine, Ser 0.76; Potassium 4.6;  Sodium 139  ?Recent Lipid Panel ?No results found for: CHOL, TRIG, HDL, CHOLHDL, VLDL, LDLCALC, LDLDIRECT ? ?PHYSICAL EXAM:   ? ?VS:  BP 122/69   Pulse 79   Ht $R'5\' 4"'JO$  (1.626 m)   Wt 171 lb (77.6 kg)   SpO2 97%   BMI 29.35 kg/m?   BMI: Body mass index is 29.35 kg/m?. ? ?GEN: Well nourished, well developed female in no acute distress ?HEENT: normocephalic, atraumatic ?Neck: no JVD, carotid bruits, or masses ?Cardiac: RRR; no murmurs, rubs, or gallops, no edema  ?Respiratory:  clear to auscultation bilaterally, normal work of breathing ?GI: soft, nontender, nondistended, + BS ?MS: no deformity or atrophy ?Skin: warm and dry, no rash ?Neuro:  Alert and Oriented x 3, Strength and sensation are intact, follows commands ?Psych: euthymic  mood, full affect ? ?Wt Readings from Last 3 Encounters:  ?08/18/21 171 lb (77.6 kg)  ?05/07/21 167 lb 3.2 oz (75.8 kg)  ?08/14/19 174 lb (78.9 kg)  ?  ? ?ASSESSMENT & PLAN:  ? ?Chronic combined CHF - she has really done remarkably well over the years with very little by way of CHF symptoms. She is tolerating addition of spironolactone well. We discussed moving forward with updated echo to reassess LVEF. We also discussed further med titration (I.e. Delene Loll, SGLT2i) but she would like to hold off pending the echocardiogram result. We again discussed referral to EP to consider defibrillator and she wants to wait for the echo result. If EF is 40% or greater, would be able to defer this, but if EF is 35% or less, I would continue to recommend EP consultation to discuss. Will update BMET again today to ensure normal K. I also noted she was taking a vitamin A supplement above the recommended daily value and suggested she discontinue this.  She is not sure that the dose we have listed is accurate and she will review at home. This was not specifically prescribed to her for a deficiency. Recommended following the recommended daily amounts for any future supplementation but would suggest a  complete hiatus from vitamin A. ? ?Essential HTN - improved s/p addition of spironolactone. Will continue to manage in context above. ? ?Mild mitral regurgitation - noted on prior echo, will reassess on upcoming s

## 2021-08-18 ENCOUNTER — Ambulatory Visit (INDEPENDENT_AMBULATORY_CARE_PROVIDER_SITE_OTHER): Payer: Medicare Other | Admitting: Physician Assistant

## 2021-08-18 ENCOUNTER — Encounter: Payer: Self-pay | Admitting: Physician Assistant

## 2021-08-18 VITALS — BP 122/69 | HR 79 | Ht 64.0 in | Wt 171.0 lb

## 2021-08-18 DIAGNOSIS — I5042 Chronic combined systolic (congestive) and diastolic (congestive) heart failure: Secondary | ICD-10-CM | POA: Diagnosis not present

## 2021-08-18 DIAGNOSIS — I428 Other cardiomyopathies: Secondary | ICD-10-CM | POA: Diagnosis not present

## 2021-08-18 DIAGNOSIS — R001 Bradycardia, unspecified: Secondary | ICD-10-CM

## 2021-08-18 DIAGNOSIS — I340 Nonrheumatic mitral (valve) insufficiency: Secondary | ICD-10-CM

## 2021-08-18 DIAGNOSIS — I10 Essential (primary) hypertension: Secondary | ICD-10-CM | POA: Diagnosis not present

## 2021-08-18 NOTE — Addendum Note (Signed)
Addended by: Ulice Brilliant T on: 08/18/2021 04:16 PM ? ? Modules accepted: Orders ? ?

## 2021-08-18 NOTE — Patient Instructions (Signed)
Medication Instructions:  ?STOP Vitamin A ?*If you need a refill on your cardiac medications before your next appointment, please call your pharmacy* ? ? ?Lab Work: ?TODAY-BMET ?If you have labs (blood work) drawn today and your tests are completely normal, you will receive your results only by: ?MyChart Message (if you have MyChart) OR ?A paper copy in the mail ?If you have any lab test that is abnormal or we need to change your treatment, we will call you to review the results. ? ? ?Testing/Procedures: ?Your physician has requested that you have an echocardiogram. Echocardiography is a painless test that uses sound waves to create images of your heart. It provides your doctor with information about the size and shape of your heart and how well your heart?s chambers and valves are working. This procedure takes approximately one hour. There are no restrictions for this procedure. ? ? ?Follow-Up: ?At [facility], you and your health needs are our priority.  As part of our continuing mission to provide you with exceptional heart care, we have created designated Provider Care Teams.  These Care Teams include your primary Cardiologist (physician) and Advanced Practice Providers (APPs -  Physician Assistants and Nurse Practitioners) who all work together to provide you with the care you need, when you need it. ? ?We recommend signing up for the patient portal called "MyChart".  Sign up information is provided on this After Visit Summary.  MyChart is used to connect with patients for Virtual Visits (Telemedicine).  Patients are able to view lab/test results, encounter notes, upcoming appointments, etc.  Non-urgent messages can be sent to your provider as well.   ?To learn more about what you can do with MyChart, go to [email or web address].   ? ?Your next appointment:   ?6 month(s) ? ?The format for your next appointment:   ?In Person ? ?Provider:   ?Melina Copa ? ? ?Other Instructions ? ? ?Important Information About  Sugar ? ? ? ? ?  ?

## 2021-08-19 LAB — BASIC METABOLIC PANEL
BUN/Creatinine Ratio: 18 (ref 12–28)
BUN: 15 mg/dL (ref 8–27)
CO2: 25 mmol/L (ref 20–29)
Calcium: 9.7 mg/dL (ref 8.7–10.3)
Chloride: 104 mmol/L (ref 96–106)
Creatinine, Ser: 0.82 mg/dL (ref 0.57–1.00)
Glucose: 99 mg/dL (ref 70–99)
Potassium: 4.8 mmol/L (ref 3.5–5.2)
Sodium: 142 mmol/L (ref 134–144)
eGFR: 76 mL/min/{1.73_m2} (ref >59)

## 2021-09-01 ENCOUNTER — Ambulatory Visit (HOSPITAL_COMMUNITY): Payer: Medicare Other | Attending: Cardiology

## 2021-09-01 DIAGNOSIS — I5042 Chronic combined systolic (congestive) and diastolic (congestive) heart failure: Secondary | ICD-10-CM | POA: Diagnosis not present

## 2021-09-01 DIAGNOSIS — I10 Essential (primary) hypertension: Secondary | ICD-10-CM

## 2021-09-01 DIAGNOSIS — R001 Bradycardia, unspecified: Secondary | ICD-10-CM | POA: Insufficient documentation

## 2021-09-01 DIAGNOSIS — I428 Other cardiomyopathies: Secondary | ICD-10-CM | POA: Diagnosis not present

## 2021-09-01 DIAGNOSIS — I340 Nonrheumatic mitral (valve) insufficiency: Secondary | ICD-10-CM | POA: Diagnosis not present

## 2021-09-01 LAB — ECHOCARDIOGRAM COMPLETE
Area-P 1/2: 3.07 cm2
S' Lateral: 3.5 cm

## 2022-04-21 IMAGING — DX DG FOOT COMPLETE 3+V*L*
3 series · 3 of 3 positions shown · non-contrast
Comparison: None.

CLINICAL DATA: 69-year-old female with a history of foot pain after
injury

EXAM:
LEFT FOOT - COMPLETE 3+ VIEW

[foot ap]
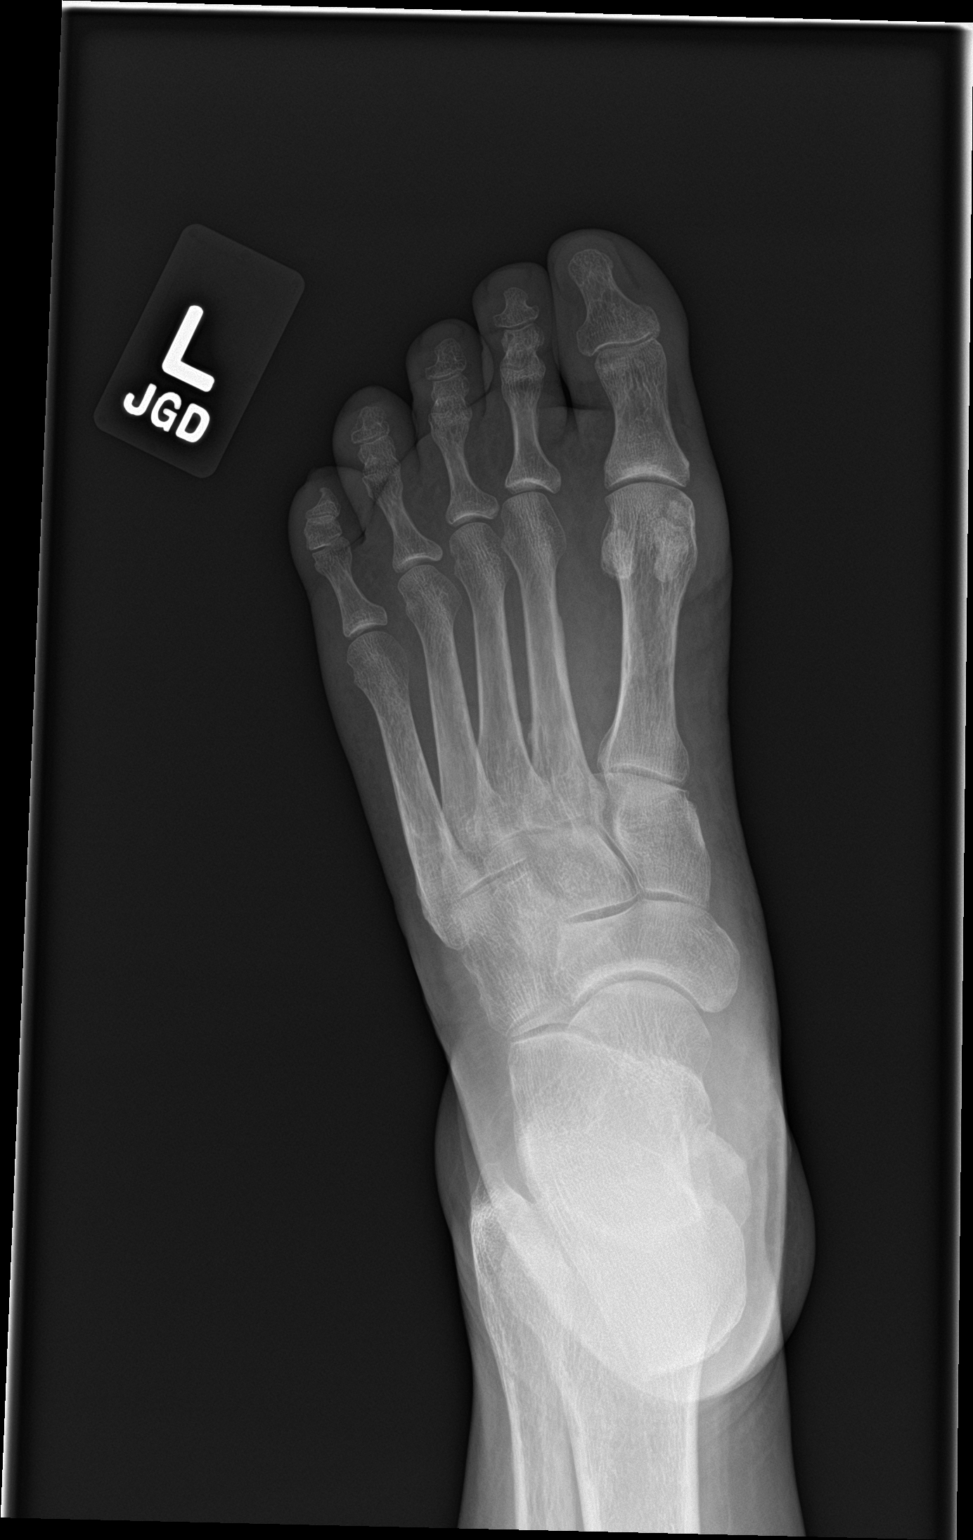

[foot obl]
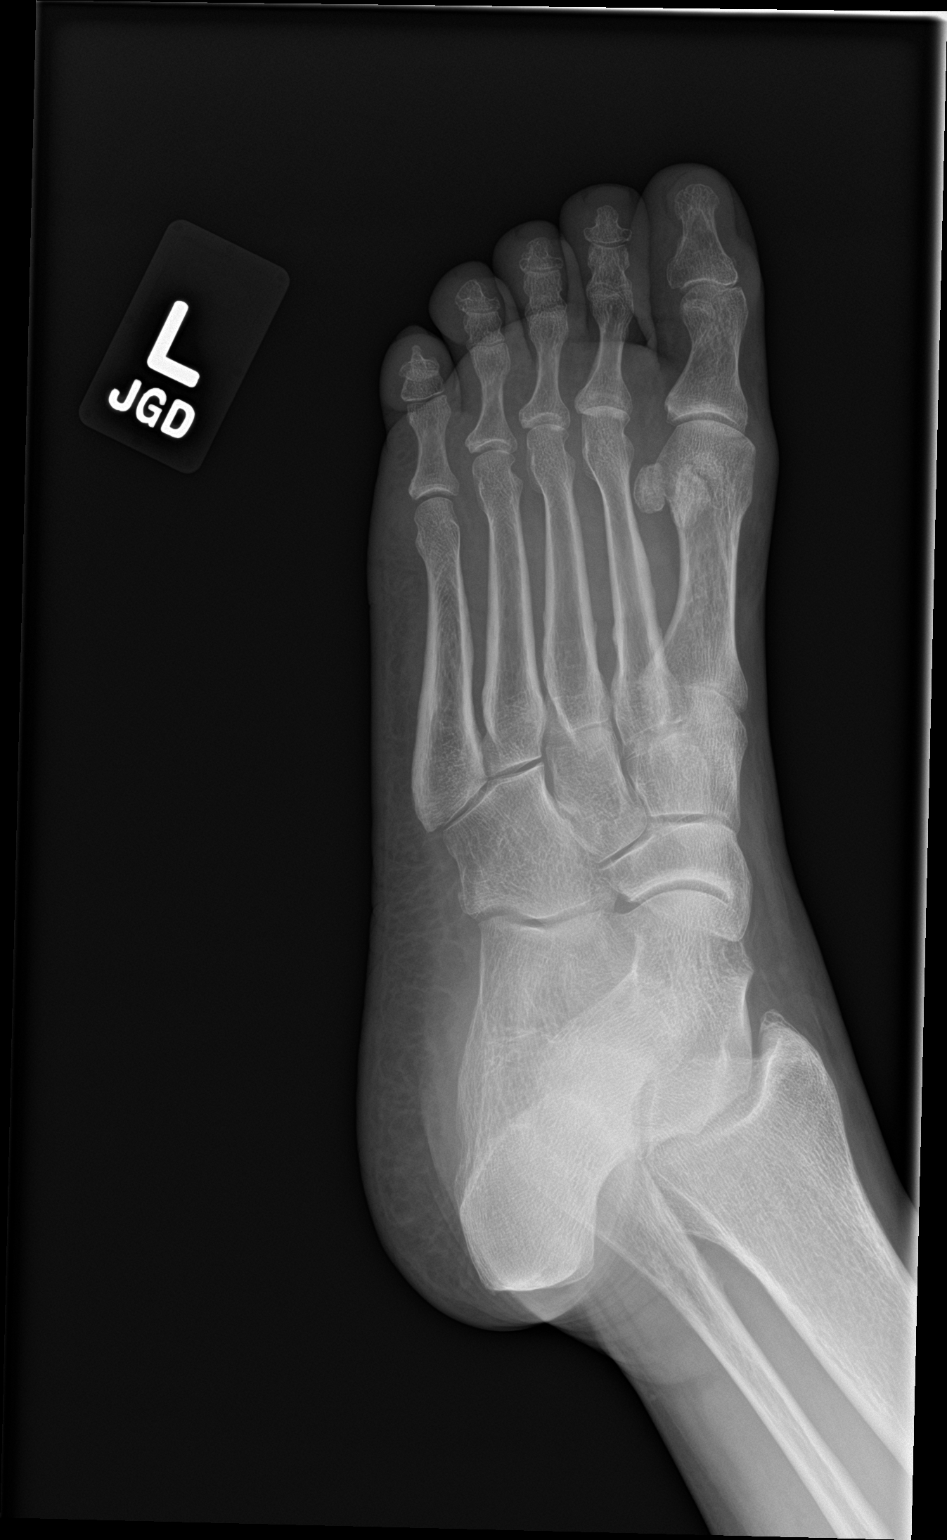

[foot lat]
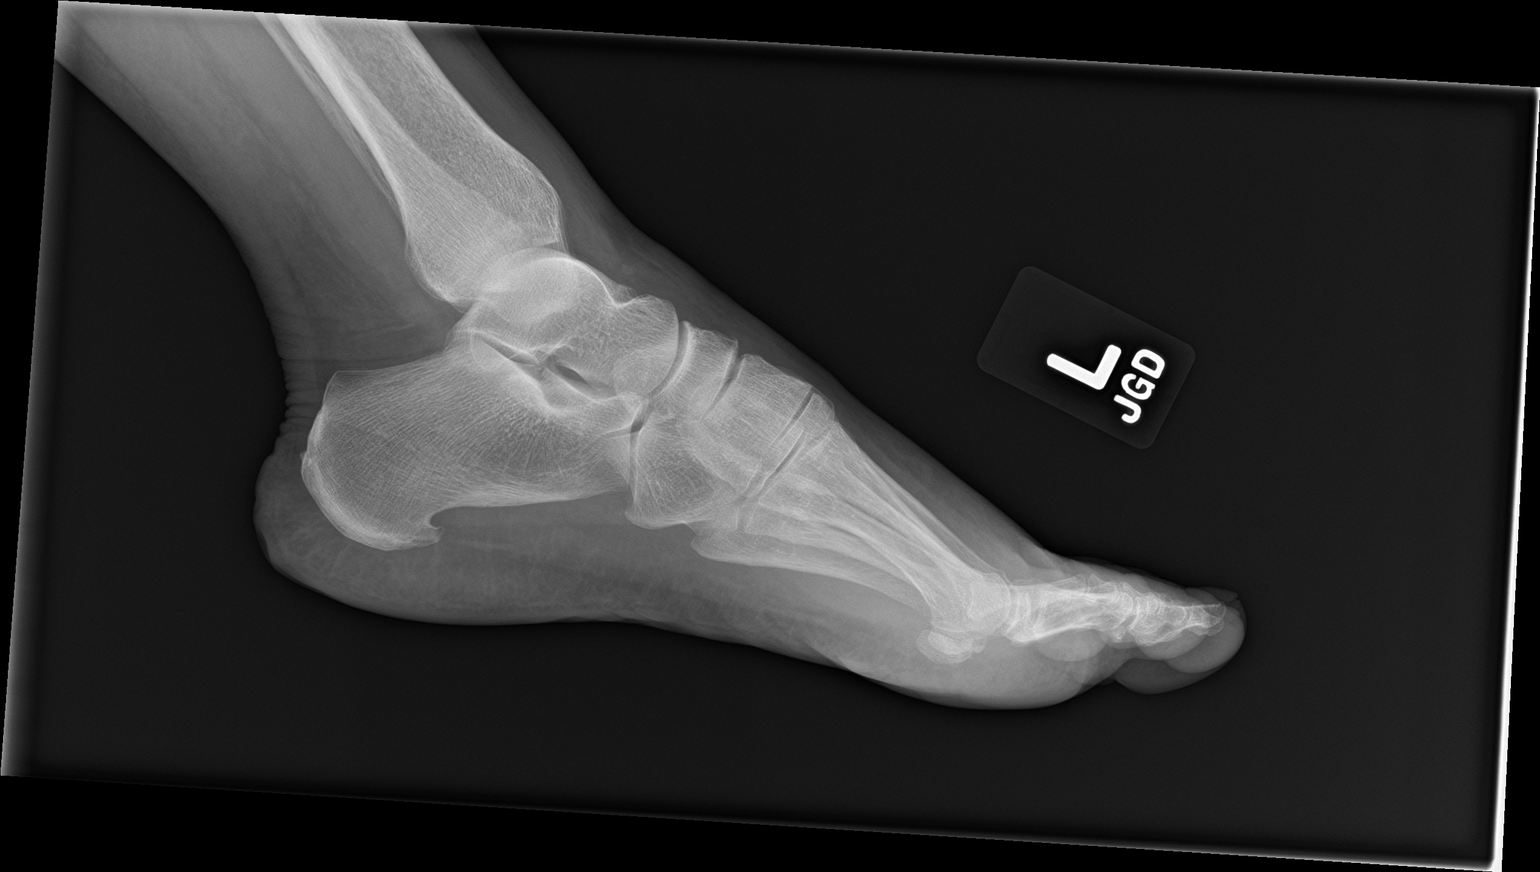

[3 of 3 positions shown; findings below may reference images not displayed]

FINDINGS: No acute displaced fracture. No radiopaque foreign body.
Degenerative changes of the interphalangeal joints. No
subluxation/dislocation. Questionable soft tissue swelling on the
dorsal forefoot.
IMPRESSION: Negative for acute bony abnormality.

Questionable mild soft tissue swelling on the dorsal forefoot

## 2022-06-16 ENCOUNTER — Other Ambulatory Visit: Payer: Self-pay | Admitting: Physician Assistant

## 2022-10-02 ENCOUNTER — Other Ambulatory Visit: Payer: Self-pay

## 2022-10-02 MED ORDER — ENTRESTO 49-51 MG PO TABS: 1.0000 | ORAL_TABLET | Freq: Two times a day (BID) | ORAL | 0 refills | Status: DC

## 2022-11-10 ENCOUNTER — Other Ambulatory Visit: Payer: Self-pay | Admitting: Physician Assistant

## 2022-11-11 MED ORDER — ENTRESTO 49-51 MG PO TABS: 1.0000 | ORAL_TABLET | Freq: Two times a day (BID) | ORAL | 0 refills | Status: DC

## 2023-02-11 ENCOUNTER — Other Ambulatory Visit: Payer: Self-pay | Admitting: Physician Assistant

## 2023-03-09 ENCOUNTER — Other Ambulatory Visit: Payer: Self-pay | Admitting: Physician Assistant

## 2023-04-02 ENCOUNTER — Other Ambulatory Visit: Payer: Self-pay | Admitting: Internal Medicine

## 2023-04-02 DIAGNOSIS — I428 Other cardiomyopathies: Secondary | ICD-10-CM | POA: Diagnosis not present

## 2023-04-02 DIAGNOSIS — Z1331 Encounter for screening for depression: Secondary | ICD-10-CM | POA: Diagnosis not present

## 2023-04-02 DIAGNOSIS — E2839 Other primary ovarian failure: Secondary | ICD-10-CM

## 2023-04-02 DIAGNOSIS — Z0000 Encounter for general adult medical examination without abnormal findings: Secondary | ICD-10-CM | POA: Diagnosis not present

## 2023-04-07 DIAGNOSIS — I110 Hypertensive heart disease with heart failure: Secondary | ICD-10-CM | POA: Diagnosis not present

## 2023-04-07 DIAGNOSIS — F4321 Adjustment disorder with depressed mood: Secondary | ICD-10-CM | POA: Diagnosis not present

## 2023-04-07 DIAGNOSIS — I5040 Unspecified combined systolic (congestive) and diastolic (congestive) heart failure: Secondary | ICD-10-CM | POA: Diagnosis not present

## 2023-04-07 DIAGNOSIS — I428 Other cardiomyopathies: Secondary | ICD-10-CM | POA: Diagnosis not present

## 2023-04-07 DIAGNOSIS — G4700 Insomnia, unspecified: Secondary | ICD-10-CM | POA: Diagnosis not present

## 2023-05-24 ENCOUNTER — Other Ambulatory Visit: Payer: Self-pay | Admitting: Internal Medicine

## 2023-05-24 DIAGNOSIS — Z1231 Encounter for screening mammogram for malignant neoplasm of breast: Secondary | ICD-10-CM

## 2023-05-27 ENCOUNTER — Ambulatory Visit: Payer: 59 | Admitting: Physician Assistant

## 2023-07-07 NOTE — Progress Notes (Deleted)
 Cardiology Office Note    Patient Name: Katie Lee Date of Encounter: 07/07/2023  Primary Care Provider:  Renford Dills, MD Primary Cardiologist:  Lesleigh Noe, MD (Inactive) Primary Electrophysiologist: None   Past Medical History    Past Medical History:  Diagnosis Date   Combined congestive systolic and diastolic heart failure [facility])    Essential hypertension, benign    Poor control related to weight gain   Insomnia    Kidney problem 09/26/2011   Cystic structure upper pole right kidney question scarring CT 09/2011   Low back pain    Major depressive disorder, recurrent episode, unspecified    Mild mitral regurgitation    Non-ischemic cardiomyopathy (HCC)    EF 45% by ECHO 2012   Obesity, unspecified    Peripheral neuropathy    Renal calculus    9 x 6 x 9 mm by CT 09/2011   Sinoatrial node dysfunction ([facility])    Sinus bradycardia, asymptomatic   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 female with a PMH of chronic combined CHF, dilated cardiomyopathy, hypothyroidism, HTN, HLD, sinus bradycardia who presents today for annual follow-up.  Katie Lee was previously followed by Dr. Katrinka Blazing for management of CHF and NICM.  She underwent an LHC on 01/25/2001 that showed normal coronaries.  She was noted to have EF of 20% and was placed on transplant list at [facility].  She had 2D echo completed that showed decreased EF of 30-35% and 08/2019 she was recommended to have ICD but patient declined further workup at that time.  Most recent TTE was completed 08/2021 showing moderately decreased LV function of 35% with global hypokinesis and mild LVH with grade 1 DD and trivial MVR.  She was last seen by Lucile Crater, PA on 08/18/2021 for follow-up.  During her visit she reported no chest pain or shortness of breath and noted improvement with her  blood pressure with spironolactone.  During visit may have discussed EP referral for ICD with plan to pursue if 2D echo showed decreased EF.  There were no medication adjustments made at that time.  She was provided referral to EP after EF showed 35% however patient did not pursue appointment.  During today's visit the patient reports*** .  Patient denies chest pain, palpitations, dyspnea, PND, orthopnea, nausea, vomiting, dizziness, syncope, edema, weight gain, or early satiety.  ***Notes: -Last ischemic evaluation: -Last echo: -Interim ED visits: Review of Systems  Please see the history of present illness.    All other systems reviewed and are otherwise negative except as noted above.  Physical Exam    Wt Readings from Last 3 Encounters:  08/18/21 171 lb (77.6 kg)  05/07/21 167 lb 3.2 oz (75.8 kg)  08/14/19 174 lb (78.9 kg)   ZO:[id number] were no vitals filed for this visit.,There is no height or weight on file to calculate BMI. GEN: Well nourished, well developed in no acute distress Neck: No JVD; No carotid bruits Pulmonary: Clear to auscultation without rales, wheezing or rhonchi  Cardiovascular: Normal rate. Regular rhythm. Normal S1. Normal S2.   Murmurs: There is no murmur.  ABDOMEN: Soft, non-tender, non-distended EXTREMITIES:  No edema; No deformity   EKG/LABS/ Recent Cardiac Studies   ECG personally reviewed by me today - ***  Risk Assessment/Calculations:   {Does this patient have ATRIAL  FIBRILLATION?:[id number]}      No results found for: "WBC", "HGB", "HCT", "MCV", "PLT" Lab Results  Component Value Date   CREATININE 0.82 08/18/2021   BUN 15 08/18/2021   NA 142 08/18/2021   K 4.8 08/18/2021   CL 104 08/18/2021   CO2 25 08/18/2021   No results found for: "CHOL", "HDL", "LDLCALC", "LDLDIRECT", "TRIG", "CHOLHDL"  No results found for: "HGBA1C" Assessment & Plan    1.  HFrEF: -Last TTE completed 08/2021 showing EF of 35%  2.  NICM:  3.  Nonrheumatic  MR:  4.  Essential hypertension:  5.  Hyperlipidemia:      Disposition: Follow-up with Lesleigh Noe, MD (Inactive) or APP in *** months {Are you ordering a CV Procedure (e.g. stress test, cath, DCCV, TEE, etc)?   Press F2        :[id number]}   Signed, Napoleon Form, Leodis Rains, NP 07/07/2023, 5:04 PM [facility]

## 2023-07-08 ENCOUNTER — Ambulatory Visit: Payer: 59 | Admitting: Nurse Practitioner

## 2023-07-08 DIAGNOSIS — I10 Essential (primary) hypertension: Secondary | ICD-10-CM

## 2023-07-08 DIAGNOSIS — I340 Nonrheumatic mitral (valve) insufficiency: Secondary | ICD-10-CM

## 2023-07-08 DIAGNOSIS — I428 Other cardiomyopathies: Secondary | ICD-10-CM

## 2023-07-08 DIAGNOSIS — E785 Hyperlipidemia, unspecified: Secondary | ICD-10-CM

## 2023-07-08 DIAGNOSIS — I5042 Chronic combined systolic (congestive) and diastolic (congestive) heart failure: Secondary | ICD-10-CM

## 2023-07-15 ENCOUNTER — Encounter: Payer: Self-pay | Admitting: Nurse Practitioner

## 2023-07-15 ENCOUNTER — Ambulatory Visit: Attending: Nurse Practitioner | Admitting: Nurse Practitioner

## 2023-07-15 VITALS — BP 130/78 | HR 76 | Resp 16 | Ht 64.0 in | Wt 183.4 lb

## 2023-07-15 DIAGNOSIS — I10 Essential (primary) hypertension: Secondary | ICD-10-CM | POA: Diagnosis not present

## 2023-07-15 DIAGNOSIS — I428 Other cardiomyopathies: Secondary | ICD-10-CM | POA: Diagnosis not present

## 2023-07-15 DIAGNOSIS — I340 Nonrheumatic mitral (valve) insufficiency: Secondary | ICD-10-CM | POA: Diagnosis not present

## 2023-07-15 DIAGNOSIS — E785 Hyperlipidemia, unspecified: Secondary | ICD-10-CM

## 2023-07-15 DIAGNOSIS — I5042 Chronic combined systolic (congestive) and diastolic (congestive) heart failure: Secondary | ICD-10-CM

## 2023-07-15 DIAGNOSIS — R001 Bradycardia, unspecified: Secondary | ICD-10-CM | POA: Diagnosis not present

## 2023-07-15 NOTE — Progress Notes (Signed)
 Cardiology Office Note:  .   Date:  07/15/2023  ID:  Katie Lee, DOB 1951-01-12, MRN [id number] PCP: Renford Dills, MD  [facility] Providers Cardiologist:  Lesleigh Noe, MD (Inactive)    Patient Profile: .      PMH NICM Chronic combined diastolic and systolic heart failure 2D echo 09/01/2021 EF 35%, G1 DD, mildly reduced RVSF Mitral regurgitation 2D echo 09/01/2021 Trivial MR Depression Prior hypothyroidism  History of nonischemic cardiomyopathy with Dr. Michaelle Copas notes referencing LVEF 40% in 2012.  No prior ischemic evaluation on file.  Patient states this was originally diagnosed in the early 2000's following a viral infection.  She reported catheterization in [facility] that did not show any coronary artery disease.  2D echo 08/2019 showed EF 30 to 35%, global HK, moderate LVH, grade 1 DD, normal RV, mild LAE, mild MR.  Previous notes indicate recommendation for referral to EP for possible ICD though patient declined further workup.  Last cardiology clinic visit was 08/18/2021 with Ronie Spies, PA.  She reported she was feeling great and did not exhibit any signs of volume overload.  She was on carvedilol 25 mg twice daily, Entresto 49-51 mg twice daily, and spironolactone 25 mg daily.  She did not wish to pursue further med titration.  Heart rate was trending in the 70s.  BP was well-controlled.  Renal function was stable on lab work completed that day.   Echo was repeated on 09/01/2021 and revealed reduced LVEF 35%, G1 DD, mildly reduced RV function.  She was recommended to start Farxiga 10 mg daily and be referred to EP for discussion of ICD implant.  She reported she would consider these changes and let us know if she wished to pursue.       History of Present Illness: .   Katie Lee is a very pleasant 73 y.o. female who is here today for follow-up of chronic combined CHF. Reports she is feeling "great." She denies shortness of breath, chest pain, orthopnea, PND,  edema.  She reports she is active but admits she is not exercising much since October. Blood pressure today is slightly elevated, attributed to a stressful incident en route to the appointment. Typically, BP is 130/80. She started cholesterol medication in December and reports no side effects. K+ was 5.5 in December but she has not had a recheck since that time. We discussed healthy dietary options. She likes to eat  but they do contain cheese, eggs, and ranch dressing.  No palpitations, presyncope, syncope.  Discussed the use of AI scribe software for clinical note transcription with the patient, who gave verbal consent to proceed.   ROS: See HPI       Studies Reviewed: Marland Kitchen   EKG Interpretation Date/Time:  Thursday July 15 2023 13:31:11 EDT Ventricular Rate:  73 PR Interval:  184 QRS Duration:  88 QT Interval:  380 QTC Calculation: 418 R Axis:   46  Text Interpretation: Normal sinus rhythm Normal ECG No previous ECGs available Confirmed by Eligha Bridegroom [phone]) on 07/15/2023 1:47:51 PM     No results found for: "LIPOA"   Risk Assessment/Calculations:             Physical Exam:   VS:  BP 130/78   Pulse 76   Resp 16   Ht 5\' 4"  (1.626 m)   Wt 183 lb 6.4 oz (83.2 kg)   SpO2 97%   BMI 31.48 kg/m    Wt Readings from Last  3 Encounters:  07/15/23 183 lb 6.4 oz (83.2 kg)  08/18/21 171 lb (77.6 kg)  05/07/21 167 lb 3.2 oz (75.8 kg)    GEN: Well nourished, well developed in no acute distress NECK: No JVD; No carotid bruits CARDIAC: RRR, no murmurs, rubs, gallops RESPIRATORY:  Clear to auscultation without rales, wheezing or rhonchi  ABDOMEN: Soft, non-tender, non-distended EXTREMITIES:  No edema; No deformity     ASSESSMENT AND PLAN: .     Chronic combined CHF/NICM: NYHA Class I. LVEF 35%, G1 DD, mild LVH, mildly reduced RV on echo 09/01/2021.  I reviewed these findings with her in detail.  She reports she is feeling great and is not having any shortness of breath, chest  discomfort, orthopnea, PND, edema.  Reports that rate is stable.  No evidence of volume overload on exam.  I reviewed treatment options including referral to Advanced HF clinic or referral to EP for consideration of device.  She kindly declines.  Advised she will let us know if she develops concerning symptoms. ER precautions advised. Low sodium, heart healthy diet advised.  We will recheck BMET today.  No concerning side effects from medications.  Continue GDMT including carvedilol, Entresto, and Aldactone.  Hyperlipidemia LDL goal < 70: She reports hx of cardiac cath in early 2000s with normal coronary arteries. In the setting of chronic CHF, would favor LDL < 70.  Lipid panel completed 04/02/2023 with total cholesterol 220, HDL 63, LDL 143, triglycerides 81.  She was started on rosuvastatin 5 mg daily by PCP.  We will recheck lipid panel today.  Hypertension: BP was initially elevated but improved on my recheck.  She almost witnessed an accident on the way to the appointment which likely elevated her BP.  We are rechecking renal function today in the setting of elevated potassium on labs completed 04/02/2023. Continue current antihypertensive therapy.   Mitral valve disease: Echo 08/2019 revealed mild MR.  Most recent echo 08/2021 reveals trivial MR.  She is asymptomatic.  We will continue to monitor clinically for now.       Disposition: 1 year with Dr. Servando Salina (transfer from Dr. Katrinka Blazing)  Signed, Eligha Bridegroom, NP-C

## 2023-07-15 NOTE — Patient Instructions (Addendum)
 Medication Instructions:   Your physician recommends that you continue on your current medications as directed. Please refer to the Current Medication list given to you today.   *If you need a refill on your cardiac medications before your next appointment, please call your pharmacy*   Lab Work:  TODAY!!!! LIPID/CMET  If you have labs (blood work) drawn today and your tests are completely normal, you will receive your results only by: MyChart Message (if you have MyChart) OR A paper copy in the mail If you have any lab test that is abnormal or we need to change your treatment, we will call you to review the results.   Testing/Procedures:  None ordered.   Follow-Up: At [facility], you and your health needs are our priority.  As part of our continuing mission to provide you with exceptional heart care, we have created designated Provider Care Teams.  These Care Teams include your primary Cardiologist (physician) and Advanced Practice Providers (APPs -  Physician Assistants and Nurse Practitioners) who all work together to provide you with the care you need, when you need it.  We recommend signing up for the patient portal called "MyChart".  Sign up information is provided on this After Visit Summary.  MyChart is used to connect with patients for Virtual Visits (Telemedicine).  Patients are able to view lab/test results, encounter notes, upcoming appointments, etc.  Non-urgent messages can be sent to your provider as well.   To learn more about what you can do with MyChart, go to [email or web address].    Your next appointment:   1 year(s)  Provider:   Dr. Thomasene Ripple      Other Instructions  Your physician wants you to follow-up in: 1 year.  You will receive a reminder letter in the mail two months in advance. If you don't receive a letter, please call our office to schedule the follow-up appointment.    1st Floor: - Lobby - Registration  - Pharmacy  - Lab -  Cafe  2nd Floor: - PV Lab - Diagnostic Testing (echo, CT, nuclear med)  3rd Floor: - Vacant  4th Floor: - TCTS (cardiothoracic surgery) - AFib Clinic - Structural Heart Clinic - Vascular Surgery  - Vascular Ultrasound  5th Floor: - HeartCare Cardiology (general and EP) - Clinical Pharmacy for coumadin, hypertension, lipid, weight-loss medications, and med management appointments    Valet parking services will be available as well.

## 2023-07-16 LAB — LIPID PANEL
Chol/HDL Ratio: 3.3 ratio (ref 0.0–4.4)
Cholesterol, Total: 195 mg/dL (ref 100–199)
HDL: 59 mg/dL (ref >39)
LDL Chol Calc (NIH): 108 mg/dL — ABNORMAL HIGH (ref 0–99)
Triglycerides: 160 mg/dL — ABNORMAL HIGH (ref 0–149)
VLDL Cholesterol Cal: 28 mg/dL (ref 5–40)

## 2023-07-16 LAB — COMPREHENSIVE METABOLIC PANEL
ALT: 7 IU/L (ref 0–32)
AST: 16 IU/L (ref 0–40)
Albumin: 4.5 g/dL (ref 3.8–4.8)
Alkaline Phosphatase: 107 IU/L (ref 44–121)
BUN/Creatinine Ratio: 13 (ref 12–28)
BUN: 13 mg/dL (ref 8–27)
Bilirubin Total: 0.3 mg/dL (ref 0.0–1.2)
CO2: 24 mmol/L (ref 20–29)
Calcium: 10.1 mg/dL (ref 8.7–10.3)
Chloride: 102 mmol/L (ref 96–106)
Creatinine, Ser: 0.98 mg/dL (ref 0.57–1.00)
Globulin, Total: 3 g/dL (ref 1.5–4.5)
Glucose: 99 mg/dL (ref 70–99)
Potassium: 5.6 mmol/L — ABNORMAL HIGH (ref 3.5–5.2)
Sodium: 138 mmol/L (ref 134–144)
Total Protein: 7.5 g/dL (ref 6.0–8.5)
eGFR: 61 mL/min/{1.73_m2} (ref >59)

## 2023-08-10 NOTE — Telephone Encounter (Signed)
 2nd call attempt to pt, no answer, unable to leave VM due to no DPR on file

## 2023-08-17 NOTE — Telephone Encounter (Signed)
 3rd call attempt, no answer, unable to LM as no DPR on file. Results mailed to pt- please call the office to discuss.

## 2023-10-01 DIAGNOSIS — I5040 Unspecified combined systolic (congestive) and diastolic (congestive) heart failure: Secondary | ICD-10-CM | POA: Diagnosis not present

## 2023-10-01 DIAGNOSIS — R739 Hyperglycemia, unspecified: Secondary | ICD-10-CM | POA: Diagnosis not present

## 2023-10-01 DIAGNOSIS — E7800 Pure hypercholesterolemia, unspecified: Secondary | ICD-10-CM | POA: Diagnosis not present

## 2023-10-01 DIAGNOSIS — I10 Essential (primary) hypertension: Secondary | ICD-10-CM | POA: Diagnosis not present

## 2023-10-01 DIAGNOSIS — G4700 Insomnia, unspecified: Secondary | ICD-10-CM | POA: Diagnosis not present

## 2023-10-01 DIAGNOSIS — I428 Other cardiomyopathies: Secondary | ICD-10-CM | POA: Diagnosis not present

## 2023-11-03 DIAGNOSIS — I340 Nonrheumatic mitral (valve) insufficiency: Secondary | ICD-10-CM | POA: Diagnosis not present

## 2023-11-03 DIAGNOSIS — I428 Other cardiomyopathies: Secondary | ICD-10-CM | POA: Diagnosis not present

## 2023-12-01 ENCOUNTER — Other Ambulatory Visit: Payer: Medicare Other

## 2023-12-08 DIAGNOSIS — H40033 Anatomical narrow angle, bilateral: Secondary | ICD-10-CM | POA: Diagnosis not present

## 2023-12-08 DIAGNOSIS — H5203 Hypermetropia, bilateral: Secondary | ICD-10-CM | POA: Diagnosis not present

## 2024-01-04 ENCOUNTER — Other Ambulatory Visit: Payer: Self-pay | Admitting: Internal Medicine

## 2024-01-04 DIAGNOSIS — E2839 Other primary ovarian failure: Secondary | ICD-10-CM

## 2024-01-07 DIAGNOSIS — I428 Other cardiomyopathies: Secondary | ICD-10-CM | POA: Diagnosis not present

## 2024-01-07 DIAGNOSIS — R7309 Other abnormal glucose: Secondary | ICD-10-CM | POA: Diagnosis not present

## 2024-01-07 DIAGNOSIS — G4700 Insomnia, unspecified: Secondary | ICD-10-CM | POA: Diagnosis not present

## 2024-01-07 DIAGNOSIS — E7800 Pure hypercholesterolemia, unspecified: Secondary | ICD-10-CM | POA: Diagnosis not present

## 2024-01-07 DIAGNOSIS — Z1231 Encounter for screening mammogram for malignant neoplasm of breast: Secondary | ICD-10-CM | POA: Diagnosis not present

## 2024-01-07 DIAGNOSIS — I10 Essential (primary) hypertension: Secondary | ICD-10-CM | POA: Diagnosis not present

## 2024-01-07 DIAGNOSIS — Z1211 Encounter for screening for malignant neoplasm of colon: Secondary | ICD-10-CM | POA: Diagnosis not present

## 2024-01-13 DIAGNOSIS — R7309 Other abnormal glucose: Secondary | ICD-10-CM | POA: Diagnosis not present
